# Patient Record
Sex: Female | Born: 1935 | Race: White | Hispanic: No | Smoking: Never smoker
Health system: Southern US, Community
[De-identification: ages and names within clinical notes are randomized; demographics above are authoritative.]

## PROBLEM LIST (undated history)

## (undated) DIAGNOSIS — I495 Sick sinus syndrome: Secondary | ICD-10-CM

## (undated) DIAGNOSIS — N289 Disorder of kidney and ureter, unspecified: Secondary | ICD-10-CM

## (undated) DIAGNOSIS — D649 Anemia, unspecified: Secondary | ICD-10-CM

## (undated) DIAGNOSIS — K5732 Diverticulitis of large intestine without perforation or abscess without bleeding: Secondary | ICD-10-CM

## (undated) DIAGNOSIS — I4430 Unspecified atrioventricular block: Secondary | ICD-10-CM

## (undated) DIAGNOSIS — F419 Anxiety disorder, unspecified: Secondary | ICD-10-CM

## (undated) DIAGNOSIS — I951 Orthostatic hypotension: Secondary | ICD-10-CM

## (undated) DIAGNOSIS — R059 Cough, unspecified: Secondary | ICD-10-CM

## (undated) DIAGNOSIS — R0989 Other specified symptoms and signs involving the circulatory and respiratory systems: Secondary | ICD-10-CM

## (undated) DIAGNOSIS — J329 Chronic sinusitis, unspecified: Secondary | ICD-10-CM

## (undated) DIAGNOSIS — E785 Hyperlipidemia, unspecified: Secondary | ICD-10-CM

## (undated) DIAGNOSIS — I2510 Atherosclerotic heart disease of native coronary artery without angina pectoris: Secondary | ICD-10-CM

## (undated) DIAGNOSIS — M545 Low back pain, unspecified: Secondary | ICD-10-CM

## (undated) DIAGNOSIS — R05 Cough: Secondary | ICD-10-CM

## (undated) DIAGNOSIS — I4891 Unspecified atrial fibrillation: Secondary | ICD-10-CM

## (undated) DIAGNOSIS — G4730 Sleep apnea, unspecified: Secondary | ICD-10-CM

## (undated) DIAGNOSIS — E7800 Pure hypercholesterolemia, unspecified: Secondary | ICD-10-CM

## (undated) DIAGNOSIS — F329 Major depressive disorder, single episode, unspecified: Secondary | ICD-10-CM

## (undated) DIAGNOSIS — F32A Depression, unspecified: Secondary | ICD-10-CM

## (undated) DIAGNOSIS — G20 Parkinson's disease: Secondary | ICD-10-CM

## (undated) DIAGNOSIS — I10 Essential (primary) hypertension: Secondary | ICD-10-CM

## (undated) DIAGNOSIS — E039 Hypothyroidism, unspecified: Secondary | ICD-10-CM

## (undated) DIAGNOSIS — G20A1 Parkinson's disease without dyskinesia, without mention of fluctuations: Secondary | ICD-10-CM

## (undated) DIAGNOSIS — R42 Dizziness and giddiness: Secondary | ICD-10-CM

## (undated) HISTORY — DX: Diverticulitis of large intestine without perforation or abscess without bleeding: K57.32

## (undated) HISTORY — DX: Chronic sinusitis, unspecified: J32.9

## (undated) HISTORY — DX: Dizziness and giddiness: R42

## (undated) HISTORY — DX: Other specified symptoms and signs involving the circulatory and respiratory systems: R09.89

## (undated) HISTORY — DX: Hypothyroidism, unspecified: E03.9

## (undated) HISTORY — DX: Hyperlipidemia, unspecified: E78.5

## (undated) HISTORY — DX: Anemia, unspecified: D64.9

## (undated) HISTORY — DX: Unspecified atrioventricular block: I44.30

## (undated) HISTORY — DX: Orthostatic hypotension: I95.1

## (undated) HISTORY — PX: CATARACT EXTRACTION: SUR2

## (undated) HISTORY — DX: Pure hypercholesterolemia, unspecified: E78.00

## (undated) HISTORY — DX: Depression, unspecified: F32.A

## (undated) HISTORY — DX: Sleep apnea, unspecified: G47.30

## (undated) HISTORY — PX: OTHER SURGICAL HISTORY: SHX169

## (undated) HISTORY — PX: PACEMAKER INSERTION: SHX728

## (undated) HISTORY — DX: Parkinson's disease without dyskinesia, without mention of fluctuations: G20.A1

## (undated) HISTORY — DX: Cough: R05

## (undated) HISTORY — DX: Essential (primary) hypertension: I10

## (undated) HISTORY — DX: Low back pain, unspecified: M54.50

## (undated) HISTORY — DX: Unspecified atrial fibrillation: I48.91

## (undated) HISTORY — DX: Atherosclerotic heart disease of native coronary artery without angina pectoris: I25.10

## (undated) HISTORY — DX: Disorder of kidney and ureter, unspecified: N28.9

## (undated) HISTORY — DX: Low back pain: M54.5

## (undated) HISTORY — DX: Parkinson's disease: G20

## (undated) HISTORY — DX: Sick sinus syndrome: I49.5

## (undated) HISTORY — DX: Cough, unspecified: R05.9

## (undated) HISTORY — PX: CORONARY ARTERY BYPASS GRAFT: SHX141

## (undated) HISTORY — DX: Major depressive disorder, single episode, unspecified: F32.9

## (undated) HISTORY — PX: POLYPECTOMY: SHX149

## (undated) HISTORY — DX: Anxiety disorder, unspecified: F41.9

---

## 1998-01-02 ENCOUNTER — Other Ambulatory Visit: Admission: RE | Admit: 1998-01-02 | Discharge: 1998-01-02 | Payer: Self-pay | Admitting: *Deleted

## 1998-02-21 ENCOUNTER — Ambulatory Visit (HOSPITAL_COMMUNITY): Admission: RE | Admit: 1998-02-21 | Discharge: 1998-02-21 | Payer: Self-pay | Admitting: *Deleted

## 1998-03-21 ENCOUNTER — Other Ambulatory Visit: Admission: RE | Admit: 1998-03-21 | Discharge: 1998-03-21 | Payer: Self-pay | Admitting: Neurology

## 1998-03-23 ENCOUNTER — Inpatient Hospital Stay (HOSPITAL_COMMUNITY): Admission: RE | Admit: 1998-03-23 | Discharge: 1998-03-29 | Payer: Self-pay | Admitting: Cardiology

## 1998-04-20 ENCOUNTER — Other Ambulatory Visit: Admission: RE | Admit: 1998-04-20 | Discharge: 1998-04-20 | Payer: Self-pay | Admitting: Internal Medicine

## 1998-05-01 ENCOUNTER — Ambulatory Visit (HOSPITAL_COMMUNITY): Admission: AD | Admit: 1998-05-01 | Discharge: 1998-05-02 | Payer: Self-pay | Admitting: Internal Medicine

## 1998-06-13 ENCOUNTER — Encounter (HOSPITAL_COMMUNITY): Admission: RE | Admit: 1998-06-13 | Discharge: 1998-09-11 | Payer: Self-pay | Admitting: Cardiology

## 1998-10-04 ENCOUNTER — Other Ambulatory Visit: Admission: RE | Admit: 1998-10-04 | Discharge: 1998-10-04 | Payer: Self-pay | Admitting: *Deleted

## 1999-09-26 ENCOUNTER — Other Ambulatory Visit: Admission: RE | Admit: 1999-09-26 | Discharge: 1999-09-26 | Payer: Self-pay | Admitting: *Deleted

## 2000-10-06 ENCOUNTER — Other Ambulatory Visit: Admission: RE | Admit: 2000-10-06 | Discharge: 2000-10-06 | Payer: Self-pay | Admitting: *Deleted

## 2000-11-14 ENCOUNTER — Encounter: Payer: Self-pay | Admitting: Gastroenterology

## 2000-11-14 ENCOUNTER — Encounter (INDEPENDENT_AMBULATORY_CARE_PROVIDER_SITE_OTHER): Payer: Self-pay

## 2000-11-14 ENCOUNTER — Other Ambulatory Visit: Admission: RE | Admit: 2000-11-14 | Discharge: 2000-11-14 | Payer: Self-pay | Admitting: Gastroenterology

## 2001-10-19 ENCOUNTER — Other Ambulatory Visit: Admission: RE | Admit: 2001-10-19 | Discharge: 2001-10-19 | Payer: Self-pay | Admitting: *Deleted

## 2004-09-28 ENCOUNTER — Ambulatory Visit: Payer: Self-pay | Admitting: Family Medicine

## 2004-10-01 ENCOUNTER — Ambulatory Visit: Payer: Self-pay | Admitting: Family Medicine

## 2004-10-01 ENCOUNTER — Ambulatory Visit: Payer: Self-pay | Admitting: Cardiology

## 2004-10-09 ENCOUNTER — Ambulatory Visit: Payer: Self-pay | Admitting: *Deleted

## 2004-10-09 ENCOUNTER — Encounter: Admission: RE | Admit: 2004-10-09 | Discharge: 2004-10-09 | Payer: Self-pay | Admitting: Family Medicine

## 2004-10-19 ENCOUNTER — Ambulatory Visit: Payer: Self-pay

## 2004-10-24 ENCOUNTER — Ambulatory Visit: Payer: Self-pay | Admitting: Cardiology

## 2004-11-06 ENCOUNTER — Ambulatory Visit: Payer: Self-pay

## 2004-11-12 ENCOUNTER — Ambulatory Visit: Payer: Self-pay | Admitting: Cardiovascular Disease

## 2004-11-29 ENCOUNTER — Ambulatory Visit: Payer: Self-pay

## 2004-12-10 ENCOUNTER — Ambulatory Visit: Payer: Self-pay | Admitting: *Deleted

## 2004-12-21 ENCOUNTER — Ambulatory Visit: Payer: Self-pay

## 2004-12-25 ENCOUNTER — Ambulatory Visit: Payer: Self-pay | Admitting: Internal Medicine

## 2004-12-28 ENCOUNTER — Ambulatory Visit: Payer: Self-pay

## 2005-01-01 ENCOUNTER — Ambulatory Visit (HOSPITAL_COMMUNITY): Admission: RE | Admit: 2005-01-01 | Discharge: 2005-01-01 | Payer: Self-pay | Admitting: Internal Medicine

## 2005-01-01 ENCOUNTER — Ambulatory Visit: Payer: Self-pay | Admitting: Internal Medicine

## 2005-01-07 ENCOUNTER — Ambulatory Visit: Payer: Self-pay | Admitting: Cardiology

## 2005-01-14 ENCOUNTER — Ambulatory Visit: Payer: Self-pay

## 2005-01-14 ENCOUNTER — Ambulatory Visit: Payer: Self-pay | Admitting: *Deleted

## 2005-01-21 ENCOUNTER — Ambulatory Visit: Payer: Self-pay | Admitting: Cardiology

## 2005-01-24 ENCOUNTER — Ambulatory Visit: Payer: Self-pay | Admitting: Internal Medicine

## 2005-01-25 ENCOUNTER — Ambulatory Visit: Payer: Self-pay | Admitting: Cardiology

## 2005-02-04 ENCOUNTER — Ambulatory Visit: Payer: Self-pay | Admitting: Cardiology

## 2005-02-11 ENCOUNTER — Ambulatory Visit: Payer: Self-pay

## 2005-03-04 ENCOUNTER — Ambulatory Visit: Payer: Self-pay | Admitting: Cardiology

## 2005-03-13 ENCOUNTER — Ambulatory Visit: Payer: Self-pay | Admitting: Cardiology

## 2005-03-14 ENCOUNTER — Ambulatory Visit: Payer: Self-pay | Admitting: Cardiology

## 2005-04-01 ENCOUNTER — Ambulatory Visit: Payer: Self-pay | Admitting: *Deleted

## 2005-04-26 ENCOUNTER — Ambulatory Visit: Payer: Self-pay | Admitting: Cardiology

## 2005-04-29 ENCOUNTER — Ambulatory Visit: Payer: Self-pay | Admitting: Cardiology

## 2005-05-14 ENCOUNTER — Ambulatory Visit: Payer: Self-pay

## 2005-05-29 ENCOUNTER — Ambulatory Visit: Payer: Self-pay | Admitting: Internal Medicine

## 2005-05-31 ENCOUNTER — Ambulatory Visit: Payer: Self-pay | Admitting: Internal Medicine

## 2005-06-03 ENCOUNTER — Ambulatory Visit: Payer: Self-pay | Admitting: Cardiology

## 2005-06-24 ENCOUNTER — Ambulatory Visit: Payer: Self-pay | Admitting: Cardiology

## 2005-07-09 ENCOUNTER — Ambulatory Visit: Payer: Self-pay | Admitting: Cardiology

## 2005-07-11 ENCOUNTER — Ambulatory Visit: Payer: Self-pay

## 2005-07-24 ENCOUNTER — Ambulatory Visit: Payer: Self-pay | Admitting: *Deleted

## 2005-08-14 ENCOUNTER — Ambulatory Visit: Payer: Self-pay | Admitting: Internal Medicine

## 2005-08-20 ENCOUNTER — Ambulatory Visit: Payer: Self-pay | Admitting: *Deleted

## 2005-09-03 ENCOUNTER — Ambulatory Visit: Payer: Self-pay | Admitting: Cardiovascular Disease

## 2005-09-06 ENCOUNTER — Ambulatory Visit: Payer: Self-pay | Admitting: Cardiology

## 2005-09-25 ENCOUNTER — Ambulatory Visit: Payer: Self-pay | Admitting: *Deleted

## 2005-10-15 ENCOUNTER — Ambulatory Visit: Payer: Self-pay | Admitting: Cardiology

## 2005-10-23 ENCOUNTER — Ambulatory Visit: Payer: Self-pay | Admitting: Cardiology

## 2005-11-05 ENCOUNTER — Ambulatory Visit: Payer: Self-pay

## 2005-11-08 ENCOUNTER — Ambulatory Visit: Payer: Self-pay | Admitting: Cardiology

## 2005-11-20 ENCOUNTER — Ambulatory Visit: Payer: Self-pay | Admitting: *Deleted

## 2005-12-18 ENCOUNTER — Ambulatory Visit: Payer: Self-pay | Admitting: Cardiology

## 2005-12-31 ENCOUNTER — Ambulatory Visit: Payer: Self-pay | Admitting: Internal Medicine

## 2006-01-06 ENCOUNTER — Ambulatory Visit: Payer: Self-pay | Admitting: Family Medicine

## 2006-01-07 ENCOUNTER — Ambulatory Visit: Payer: Self-pay | Admitting: Internal Medicine

## 2006-01-08 ENCOUNTER — Ambulatory Visit: Payer: Self-pay | Admitting: Family Medicine

## 2006-01-29 ENCOUNTER — Ambulatory Visit: Payer: Self-pay | Admitting: Internal Medicine

## 2006-02-18 ENCOUNTER — Ambulatory Visit: Payer: Self-pay | Admitting: Cardiology

## 2006-02-26 ENCOUNTER — Ambulatory Visit: Payer: Self-pay | Admitting: Cardiology

## 2006-03-03 ENCOUNTER — Ambulatory Visit: Payer: Self-pay | Admitting: Cardiology

## 2006-03-26 ENCOUNTER — Ambulatory Visit: Payer: Self-pay | Admitting: Cardiology

## 2006-04-07 ENCOUNTER — Ambulatory Visit: Payer: Self-pay | Admitting: Internal Medicine

## 2006-04-29 ENCOUNTER — Ambulatory Visit: Payer: Self-pay | Admitting: Cardiology

## 2006-05-30 ENCOUNTER — Ambulatory Visit: Payer: Self-pay | Admitting: Internal Medicine

## 2006-06-25 ENCOUNTER — Ambulatory Visit: Payer: Self-pay | Admitting: *Deleted

## 2006-07-07 ENCOUNTER — Ambulatory Visit: Payer: Self-pay | Admitting: Internal Medicine

## 2006-07-16 ENCOUNTER — Ambulatory Visit: Payer: Self-pay | Admitting: Cardiology

## 2006-08-05 ENCOUNTER — Ambulatory Visit: Payer: Self-pay | Admitting: Cardiovascular Disease

## 2006-08-05 ENCOUNTER — Ambulatory Visit: Payer: Self-pay | Admitting: Internal Medicine

## 2006-08-08 ENCOUNTER — Encounter: Admission: RE | Admit: 2006-08-08 | Discharge: 2006-08-08 | Payer: Self-pay | Admitting: Internal Medicine

## 2006-08-18 ENCOUNTER — Ambulatory Visit: Payer: Self-pay | Admitting: Internal Medicine

## 2006-08-21 ENCOUNTER — Ambulatory Visit: Payer: Self-pay | Admitting: Cardiology

## 2006-08-28 ENCOUNTER — Ambulatory Visit: Payer: Self-pay | Admitting: Cardiology

## 2006-09-10 ENCOUNTER — Ambulatory Visit: Payer: Self-pay | Admitting: Internal Medicine

## 2006-09-24 ENCOUNTER — Ambulatory Visit: Payer: Self-pay | Admitting: Cardiology

## 2006-10-07 ENCOUNTER — Ambulatory Visit: Payer: Self-pay | Admitting: Internal Medicine

## 2006-10-08 ENCOUNTER — Ambulatory Visit: Payer: Self-pay | Admitting: Cardiology

## 2006-10-10 ENCOUNTER — Ambulatory Visit: Payer: Self-pay | Admitting: Internal Medicine

## 2006-10-15 ENCOUNTER — Ambulatory Visit: Payer: Self-pay | Admitting: Cardiology

## 2006-10-22 ENCOUNTER — Ambulatory Visit: Payer: Self-pay | Admitting: Cardiovascular Disease

## 2006-10-30 ENCOUNTER — Ambulatory Visit: Payer: Self-pay

## 2006-10-30 ENCOUNTER — Ambulatory Visit: Payer: Self-pay | Admitting: Cardiovascular Disease

## 2006-11-19 ENCOUNTER — Ambulatory Visit: Payer: Self-pay | Admitting: Cardiology

## 2006-12-03 ENCOUNTER — Ambulatory Visit: Payer: Self-pay | Admitting: Cardiology

## 2006-12-03 ENCOUNTER — Ambulatory Visit: Payer: Self-pay | Admitting: Internal Medicine

## 2006-12-31 ENCOUNTER — Ambulatory Visit: Payer: Self-pay | Admitting: Cardiology

## 2007-01-01 ENCOUNTER — Ambulatory Visit: Payer: Self-pay | Admitting: Internal Medicine

## 2007-01-26 ENCOUNTER — Ambulatory Visit: Payer: Self-pay | Admitting: Internal Medicine

## 2007-01-28 ENCOUNTER — Ambulatory Visit: Payer: Self-pay | Admitting: *Deleted

## 2007-02-19 ENCOUNTER — Ambulatory Visit: Payer: Self-pay | Admitting: Internal Medicine

## 2007-02-19 LAB — CONVERTED CEMR LAB
BUN: 28 mg/dL — ABNORMAL HIGH (ref 6–23)
TSH: 2.37 microintl units/mL (ref 0.35–5.50)

## 2007-02-25 ENCOUNTER — Ambulatory Visit: Payer: Self-pay | Admitting: Cardiovascular Disease

## 2007-02-26 ENCOUNTER — Ambulatory Visit: Payer: Self-pay | Admitting: Cardiology

## 2007-02-26 LAB — CONVERTED CEMR LAB
ALT: 9 units/L (ref 0–40)
AST: 16 units/L (ref 0–37)
Albumin: 3.4 g/dL — ABNORMAL LOW (ref 3.5–5.2)
Alkaline Phosphatase: 56 units/L (ref 39–117)
Bilirubin, Direct: 0.1 mg/dL (ref 0.0–0.3)
Cholesterol: 247 mg/dL (ref 0–200)
Direct LDL: 162.4 mg/dL
HDL: 51.7 mg/dL (ref >39.0)
Total Bilirubin: 0.7 mg/dL (ref 0.3–1.2)
Total CHOL/HDL Ratio: 4.8
Total Protein: 6.2 g/dL (ref 6.0–8.3)
Triglycerides: 174 mg/dL — ABNORMAL HIGH (ref 0–149)
VLDL: 35 mg/dL (ref 0–40)

## 2007-03-02 DIAGNOSIS — I4891 Unspecified atrial fibrillation: Secondary | ICD-10-CM | POA: Insufficient documentation

## 2007-03-02 DIAGNOSIS — D649 Anemia, unspecified: Secondary | ICD-10-CM | POA: Insufficient documentation

## 2007-03-02 DIAGNOSIS — K5730 Diverticulosis of large intestine without perforation or abscess without bleeding: Secondary | ICD-10-CM | POA: Insufficient documentation

## 2007-03-02 DIAGNOSIS — R0989 Other specified symptoms and signs involving the circulatory and respiratory systems: Secondary | ICD-10-CM

## 2007-03-02 DIAGNOSIS — G4730 Sleep apnea, unspecified: Secondary | ICD-10-CM | POA: Insufficient documentation

## 2007-03-05 ENCOUNTER — Ambulatory Visit: Payer: Self-pay | Admitting: Cardiology

## 2007-03-16 ENCOUNTER — Ambulatory Visit: Payer: Self-pay | Admitting: Internal Medicine

## 2007-03-19 ENCOUNTER — Ambulatory Visit: Payer: Self-pay

## 2007-03-24 ENCOUNTER — Ambulatory Visit: Payer: Self-pay | Admitting: Internal Medicine

## 2007-03-25 ENCOUNTER — Ambulatory Visit: Payer: Self-pay | Admitting: Internal Medicine

## 2007-04-22 ENCOUNTER — Ambulatory Visit: Payer: Self-pay | Admitting: Cardiology

## 2007-04-22 ENCOUNTER — Encounter: Payer: Self-pay | Admitting: Internal Medicine

## 2007-04-22 ENCOUNTER — Ambulatory Visit: Payer: Self-pay | Admitting: Internal Medicine

## 2007-04-27 ENCOUNTER — Ambulatory Visit: Payer: Self-pay | Admitting: Cardiovascular Disease

## 2007-04-27 ENCOUNTER — Ambulatory Visit: Payer: Self-pay | Admitting: Internal Medicine

## 2007-04-27 ENCOUNTER — Encounter: Admission: RE | Admit: 2007-04-27 | Discharge: 2007-04-27 | Payer: Self-pay | Admitting: Internal Medicine

## 2007-05-11 ENCOUNTER — Ambulatory Visit: Payer: Self-pay | Admitting: Cardiology

## 2007-05-11 LAB — CONVERTED CEMR LAB
BUN: 49 mg/dL — ABNORMAL HIGH (ref 6–23)
Basophils Absolute: 0 10*3/uL (ref 0.0–0.1)
Basophils Relative: 0 % (ref 0.0–1.0)
CO2: 32 meq/L (ref 19–32)
Calcium: 9.5 mg/dL (ref 8.4–10.5)
Chloride: 102 meq/L (ref 96–112)
Creatinine, Ser: 1.6 mg/dL — ABNORMAL HIGH (ref 0.4–1.2)
Eosinophils Absolute: 0 10*3/uL (ref 0.0–0.6)
Eosinophils Relative: 0.2 % (ref 0.0–5.0)
GFR calc Af Amer: 40 mL/min
GFR calc non Af Amer: 33 mL/min
Glucose, Bld: 139 mg/dL — ABNORMAL HIGH (ref 70–99)
HCT: 40.7 % (ref 36.0–46.0)
Hemoglobin: 14.1 g/dL (ref 12.0–15.0)
Lymphocytes Relative: 13.9 % (ref 12.0–46.0)
MCHC: 34.5 g/dL (ref 30.0–36.0)
MCV: 91.2 fL (ref 78.0–100.0)
Monocytes Absolute: 0.3 10*3/uL (ref 0.2–0.7)
Monocytes Relative: 2.6 % — ABNORMAL LOW (ref 3.0–11.0)
Neutro Abs: 8.6 10*3/uL — ABNORMAL HIGH (ref 1.4–7.7)
Neutrophils Relative %: 83.3 % — ABNORMAL HIGH (ref 43.0–77.0)
Platelets: 286 10*3/uL (ref 150–400)
Potassium: 4.7 meq/L (ref 3.5–5.1)
RBC: 4.47 M/uL (ref 3.87–5.11)
RDW: 14.2 % (ref 11.5–14.6)
Sodium: 139 meq/L (ref 135–145)
WBC: 10.3 10*3/uL (ref 4.5–10.5)

## 2007-05-18 ENCOUNTER — Ambulatory Visit: Payer: Self-pay | Admitting: Cardiovascular Disease

## 2007-05-18 ENCOUNTER — Ambulatory Visit: Payer: Self-pay | Admitting: Cardiology

## 2007-05-18 LAB — CONVERTED CEMR LAB
BUN: 26 mg/dL — ABNORMAL HIGH (ref 6–23)
CO2: 30 meq/L (ref 19–32)
Calcium: 8.8 mg/dL (ref 8.4–10.5)
Chloride: 112 meq/L (ref 96–112)
Creatinine, Ser: 1.4 mg/dL — ABNORMAL HIGH (ref 0.4–1.2)
GFR calc Af Amer: 47 mL/min
GFR calc non Af Amer: 39 mL/min
Glucose, Bld: 90 mg/dL (ref 70–99)
Potassium: 4.3 meq/L (ref 3.5–5.1)
Sodium: 143 meq/L (ref 135–145)

## 2007-06-11 ENCOUNTER — Ambulatory Visit: Payer: Self-pay | Admitting: Cardiology

## 2007-07-13 ENCOUNTER — Ambulatory Visit: Payer: Self-pay | Admitting: Cardiology

## 2007-07-14 ENCOUNTER — Ambulatory Visit: Payer: Self-pay | Admitting: Internal Medicine

## 2007-08-06 ENCOUNTER — Ambulatory Visit: Payer: Self-pay | Admitting: Cardiology

## 2007-08-13 ENCOUNTER — Ambulatory Visit: Payer: Self-pay | Admitting: Cardiology

## 2007-08-13 ENCOUNTER — Ambulatory Visit: Payer: Self-pay

## 2007-08-13 LAB — CONVERTED CEMR LAB
Albumin: 3.6 g/dL (ref 3.5–5.2)
Alkaline Phosphatase: 50 units/L (ref 39–117)
BUN: 38 mg/dL — ABNORMAL HIGH (ref 6–23)
Bilirubin, Direct: 0.1 mg/dL (ref 0.0–0.3)
CO2: 30 meq/L (ref 19–32)
Calcium: 9.8 mg/dL (ref 8.4–10.5)
Chloride: 105 meq/L (ref 96–112)
GFR calc Af Amer: 35 mL/min
GFR calc non Af Amer: 29 mL/min
Glucose, Bld: 103 mg/dL — ABNORMAL HIGH (ref 70–99)
HDL: 51.4 mg/dL (ref >39.0)
LDL Cholesterol: 71 mg/dL (ref 0–99)
Potassium: 4.1 meq/L (ref 3.5–5.1)
Sodium: 142 meq/L (ref 135–145)
Total CHOL/HDL Ratio: 2.8
Triglycerides: 106 mg/dL (ref 0–149)
VLDL: 21 mg/dL (ref 0–40)

## 2007-08-24 ENCOUNTER — Ambulatory Visit: Payer: Self-pay | Admitting: Cardiology

## 2007-08-24 LAB — CONVERTED CEMR LAB
BUN: 27 mg/dL — ABNORMAL HIGH (ref 6–23)
CO2: 34 meq/L — ABNORMAL HIGH (ref 19–32)
Calcium: 9.6 mg/dL (ref 8.4–10.5)
Chloride: 106 meq/L (ref 96–112)
Creatinine, Ser: 1.6 mg/dL — ABNORMAL HIGH (ref 0.4–1.2)
GFR calc Af Amer: 40 mL/min
GFR calc non Af Amer: 33 mL/min
Potassium: 4 meq/L (ref 3.5–5.1)
Sodium: 144 meq/L (ref 135–145)

## 2007-08-26 ENCOUNTER — Encounter: Payer: Self-pay | Admitting: Internal Medicine

## 2007-08-28 ENCOUNTER — Ambulatory Visit: Payer: Self-pay | Admitting: Internal Medicine

## 2007-08-28 DIAGNOSIS — N259 Disorder resulting from impaired renal tubular function, unspecified: Secondary | ICD-10-CM | POA: Insufficient documentation

## 2007-09-02 ENCOUNTER — Telehealth (INDEPENDENT_AMBULATORY_CARE_PROVIDER_SITE_OTHER): Payer: Self-pay | Admitting: *Deleted

## 2007-09-03 ENCOUNTER — Ambulatory Visit: Payer: Self-pay

## 2007-09-08 ENCOUNTER — Ambulatory Visit: Payer: Self-pay | Admitting: Internal Medicine

## 2007-09-14 ENCOUNTER — Ambulatory Visit: Payer: Self-pay | Admitting: Internal Medicine

## 2007-09-16 ENCOUNTER — Ambulatory Visit: Payer: Self-pay | Admitting: Cardiology

## 2007-09-23 ENCOUNTER — Ambulatory Visit: Payer: Self-pay | Admitting: Cardiology

## 2007-09-23 ENCOUNTER — Telehealth: Payer: Self-pay | Admitting: Internal Medicine

## 2007-09-30 ENCOUNTER — Telehealth (INDEPENDENT_AMBULATORY_CARE_PROVIDER_SITE_OTHER): Payer: Self-pay | Admitting: *Deleted

## 2007-10-13 ENCOUNTER — Ambulatory Visit: Payer: Self-pay | Admitting: Internal Medicine

## 2007-10-19 ENCOUNTER — Encounter: Payer: Self-pay | Admitting: Internal Medicine

## 2007-10-27 ENCOUNTER — Ambulatory Visit: Payer: Self-pay | Admitting: Internal Medicine

## 2007-11-03 ENCOUNTER — Ambulatory Visit: Payer: Self-pay

## 2007-11-03 ENCOUNTER — Ambulatory Visit: Payer: Self-pay | Admitting: Cardiology

## 2007-11-03 ENCOUNTER — Encounter: Payer: Self-pay | Admitting: Internal Medicine

## 2007-11-03 LAB — CONVERTED CEMR LAB
Calcium: 9.7 mg/dL (ref 8.4–10.5)
Chloride: 99 meq/L (ref 96–112)
Creatinine, Ser: 1.4 mg/dL — ABNORMAL HIGH (ref 0.4–1.2)
GFR calc Af Amer: 47 mL/min
GFR calc non Af Amer: 39 mL/min
Glucose, Bld: 84 mg/dL (ref 70–99)
Potassium: 4.1 meq/L (ref 3.5–5.1)
Sodium: 137 meq/L (ref 135–145)

## 2007-12-02 ENCOUNTER — Ambulatory Visit: Payer: Self-pay | Admitting: Cardiovascular Disease

## 2007-12-21 ENCOUNTER — Telehealth (INDEPENDENT_AMBULATORY_CARE_PROVIDER_SITE_OTHER): Payer: Self-pay | Admitting: *Deleted

## 2007-12-30 ENCOUNTER — Ambulatory Visit: Payer: Self-pay | Admitting: Internal Medicine

## 2008-01-05 ENCOUNTER — Ambulatory Visit: Payer: Self-pay | Admitting: Internal Medicine

## 2008-01-27 ENCOUNTER — Ambulatory Visit: Payer: Self-pay | Admitting: Cardiology

## 2008-02-04 ENCOUNTER — Ambulatory Visit: Payer: Self-pay | Admitting: Cardiology

## 2008-02-04 ENCOUNTER — Encounter: Payer: Self-pay | Admitting: Internal Medicine

## 2008-02-04 LAB — CONVERTED CEMR LAB
BUN: 35 mg/dL — ABNORMAL HIGH (ref 6–23)
CO2: 30 meq/L (ref 19–32)
Calcium: 9.3 mg/dL (ref 8.4–10.5)
Chloride: 105 meq/L (ref 96–112)
Creatinine, Ser: 1.7 mg/dL — ABNORMAL HIGH (ref 0.4–1.2)
GFR calc Af Amer: 37 mL/min
GFR calc non Af Amer: 31 mL/min
Glucose, Bld: 116 mg/dL — ABNORMAL HIGH (ref 70–99)
Potassium: 4.1 meq/L (ref 3.5–5.1)
Sodium: 141 meq/L (ref 135–145)

## 2008-02-24 ENCOUNTER — Ambulatory Visit: Payer: Self-pay | Admitting: Cardiology

## 2008-02-24 ENCOUNTER — Ambulatory Visit: Payer: Self-pay | Admitting: Internal Medicine

## 2008-02-24 LAB — CONVERTED CEMR LAB
CO2: 29 meq/L (ref 19–32)
Calcium: 9.5 mg/dL (ref 8.4–10.5)
Chloride: 108 meq/L (ref 96–112)
Creatinine, Ser: 1.4 mg/dL — ABNORMAL HIGH (ref 0.4–1.2)
GFR calc Af Amer: 47 mL/min
GFR calc non Af Amer: 39 mL/min
Glucose, Bld: 78 mg/dL (ref 70–99)
Potassium: 4.4 meq/L (ref 3.5–5.1)
Sodium: 142 meq/L (ref 135–145)

## 2008-03-22 ENCOUNTER — Encounter: Payer: Self-pay | Admitting: Internal Medicine

## 2008-03-30 ENCOUNTER — Ambulatory Visit: Payer: Self-pay | Admitting: Cardiology

## 2008-03-30 ENCOUNTER — Ambulatory Visit: Payer: Self-pay | Admitting: Internal Medicine

## 2008-03-30 LAB — CONVERTED CEMR LAB
BUN: 30 mg/dL — ABNORMAL HIGH (ref 6–23)
CO2: 30 meq/L (ref 19–32)
Calcium: 9.6 mg/dL (ref 8.4–10.5)
Chloride: 109 meq/L (ref 96–112)
Creatinine, Ser: 1.5 mg/dL — ABNORMAL HIGH (ref 0.4–1.2)
GFR calc Af Amer: 43 mL/min
GFR calc non Af Amer: 36 mL/min
Glucose, Bld: 79 mg/dL (ref 70–99)
Sodium: 142 meq/L (ref 135–145)

## 2008-04-05 ENCOUNTER — Ambulatory Visit: Payer: Self-pay | Admitting: Internal Medicine

## 2008-04-26 ENCOUNTER — Ambulatory Visit: Payer: Self-pay | Admitting: Internal Medicine

## 2008-05-03 ENCOUNTER — Ambulatory Visit: Payer: Self-pay | Admitting: Cardiovascular Disease

## 2008-05-03 ENCOUNTER — Ambulatory Visit: Payer: Self-pay

## 2008-05-16 ENCOUNTER — Telehealth (INDEPENDENT_AMBULATORY_CARE_PROVIDER_SITE_OTHER): Payer: Self-pay | Admitting: *Deleted

## 2008-06-01 ENCOUNTER — Ambulatory Visit: Payer: Self-pay | Admitting: Internal Medicine

## 2008-06-20 ENCOUNTER — Telehealth (INDEPENDENT_AMBULATORY_CARE_PROVIDER_SITE_OTHER): Payer: Self-pay | Admitting: *Deleted

## 2008-06-22 ENCOUNTER — Ambulatory Visit: Payer: Self-pay | Admitting: Cardiology

## 2008-07-05 ENCOUNTER — Ambulatory Visit: Payer: Self-pay | Admitting: Internal Medicine

## 2008-07-06 ENCOUNTER — Ambulatory Visit: Payer: Self-pay | Admitting: Cardiology

## 2008-07-20 ENCOUNTER — Ambulatory Visit: Payer: Self-pay | Admitting: Cardiology

## 2008-07-22 ENCOUNTER — Ambulatory Visit: Payer: Self-pay | Admitting: Family Medicine

## 2008-07-22 ENCOUNTER — Telehealth (INDEPENDENT_AMBULATORY_CARE_PROVIDER_SITE_OTHER): Payer: Self-pay | Admitting: *Deleted

## 2008-07-22 DIAGNOSIS — M545 Low back pain: Secondary | ICD-10-CM

## 2008-07-25 ENCOUNTER — Telehealth (INDEPENDENT_AMBULATORY_CARE_PROVIDER_SITE_OTHER): Payer: Self-pay | Admitting: *Deleted

## 2008-07-28 ENCOUNTER — Ambulatory Visit: Payer: Self-pay | Admitting: Internal Medicine

## 2008-07-29 ENCOUNTER — Telehealth (INDEPENDENT_AMBULATORY_CARE_PROVIDER_SITE_OTHER): Payer: Self-pay | Admitting: *Deleted

## 2008-08-04 ENCOUNTER — Encounter (INDEPENDENT_AMBULATORY_CARE_PROVIDER_SITE_OTHER): Payer: Self-pay | Admitting: *Deleted

## 2008-08-10 ENCOUNTER — Ambulatory Visit: Payer: Self-pay | Admitting: Cardiovascular Disease

## 2008-08-29 ENCOUNTER — Ambulatory Visit: Payer: Self-pay | Admitting: Internal Medicine

## 2008-08-29 DIAGNOSIS — E039 Hypothyroidism, unspecified: Secondary | ICD-10-CM | POA: Insufficient documentation

## 2008-08-29 DIAGNOSIS — F329 Major depressive disorder, single episode, unspecified: Secondary | ICD-10-CM | POA: Insufficient documentation

## 2008-08-29 DIAGNOSIS — F411 Generalized anxiety disorder: Secondary | ICD-10-CM | POA: Insufficient documentation

## 2008-08-29 DIAGNOSIS — E785 Hyperlipidemia, unspecified: Secondary | ICD-10-CM

## 2008-08-29 DIAGNOSIS — I10 Essential (primary) hypertension: Secondary | ICD-10-CM | POA: Insufficient documentation

## 2008-08-29 LAB — CONVERTED CEMR LAB: TSH: 1.69 microintl units/mL (ref 0.35–5.50)

## 2008-08-30 ENCOUNTER — Encounter (INDEPENDENT_AMBULATORY_CARE_PROVIDER_SITE_OTHER): Payer: Self-pay | Admitting: *Deleted

## 2008-08-31 ENCOUNTER — Ambulatory Visit: Payer: Self-pay | Admitting: Internal Medicine

## 2008-09-05 ENCOUNTER — Encounter: Payer: Self-pay | Admitting: Family Medicine

## 2008-09-08 ENCOUNTER — Ambulatory Visit: Payer: Self-pay | Admitting: Internal Medicine

## 2008-09-09 ENCOUNTER — Ambulatory Visit (HOSPITAL_COMMUNITY): Admission: RE | Admit: 2008-09-09 | Discharge: 2008-09-09 | Payer: Self-pay | Admitting: Neurosurgery

## 2008-09-14 ENCOUNTER — Encounter: Payer: Self-pay | Admitting: Family Medicine

## 2008-09-16 ENCOUNTER — Ambulatory Visit: Payer: Self-pay | Admitting: Cardiology

## 2008-09-23 ENCOUNTER — Ambulatory Visit: Payer: Self-pay | Admitting: Internal Medicine

## 2008-09-27 ENCOUNTER — Ambulatory Visit (HOSPITAL_COMMUNITY): Admission: RE | Admit: 2008-09-27 | Discharge: 2008-09-27 | Payer: Self-pay | Admitting: Neurosurgery

## 2008-09-27 ENCOUNTER — Ambulatory Visit: Payer: Self-pay | Admitting: Cardiology

## 2008-09-28 ENCOUNTER — Ambulatory Visit: Payer: Self-pay

## 2008-09-28 ENCOUNTER — Encounter: Payer: Self-pay | Admitting: Internal Medicine

## 2008-10-04 ENCOUNTER — Ambulatory Visit: Payer: Self-pay | Admitting: Internal Medicine

## 2008-10-07 ENCOUNTER — Ambulatory Visit: Payer: Self-pay | Admitting: Internal Medicine

## 2008-11-02 ENCOUNTER — Ambulatory Visit: Payer: Self-pay | Admitting: Internal Medicine

## 2008-11-03 ENCOUNTER — Ambulatory Visit: Payer: Self-pay | Admitting: Cardiology

## 2008-11-03 ENCOUNTER — Ambulatory Visit: Payer: Self-pay

## 2008-11-10 ENCOUNTER — Inpatient Hospital Stay (HOSPITAL_COMMUNITY): Admission: RE | Admit: 2008-11-10 | Discharge: 2008-11-11 | Payer: Self-pay | Admitting: Neurosurgery

## 2008-11-23 ENCOUNTER — Ambulatory Visit: Payer: Self-pay | Admitting: Internal Medicine

## 2008-12-01 ENCOUNTER — Ambulatory Visit: Payer: Self-pay | Admitting: Cardiovascular Disease

## 2008-12-06 ENCOUNTER — Ambulatory Visit: Payer: Self-pay | Admitting: Cardiology

## 2008-12-06 ENCOUNTER — Ambulatory Visit: Payer: Self-pay | Admitting: Internal Medicine

## 2008-12-07 ENCOUNTER — Encounter: Payer: Self-pay | Admitting: Family Medicine

## 2009-01-04 ENCOUNTER — Ambulatory Visit: Payer: Self-pay | Admitting: Cardiology

## 2009-01-18 ENCOUNTER — Encounter: Payer: Self-pay | Admitting: Family Medicine

## 2009-02-01 ENCOUNTER — Ambulatory Visit: Payer: Self-pay | Admitting: Cardiovascular Disease

## 2009-03-01 ENCOUNTER — Ambulatory Visit: Payer: Self-pay | Admitting: Cardiology

## 2009-03-10 ENCOUNTER — Encounter (INDEPENDENT_AMBULATORY_CARE_PROVIDER_SITE_OTHER): Payer: Self-pay | Admitting: *Deleted

## 2009-03-29 ENCOUNTER — Ambulatory Visit: Payer: Self-pay | Admitting: Cardiovascular Disease

## 2009-04-03 DIAGNOSIS — Z9189 Other specified personal risk factors, not elsewhere classified: Secondary | ICD-10-CM | POA: Insufficient documentation

## 2009-04-03 DIAGNOSIS — Z9849 Cataract extraction status, unspecified eye: Secondary | ICD-10-CM | POA: Insufficient documentation

## 2009-04-03 DIAGNOSIS — G20 Parkinson's disease: Secondary | ICD-10-CM

## 2009-04-03 DIAGNOSIS — Z951 Presence of aortocoronary bypass graft: Secondary | ICD-10-CM | POA: Insufficient documentation

## 2009-04-03 DIAGNOSIS — I2510 Atherosclerotic heart disease of native coronary artery without angina pectoris: Secondary | ICD-10-CM | POA: Insufficient documentation

## 2009-04-04 ENCOUNTER — Ambulatory Visit: Payer: Self-pay | Admitting: Internal Medicine

## 2009-04-06 ENCOUNTER — Ambulatory Visit: Payer: Self-pay | Admitting: Cardiology

## 2009-04-12 ENCOUNTER — Telehealth: Payer: Self-pay | Admitting: Cardiology

## 2009-04-20 ENCOUNTER — Encounter: Payer: Self-pay | Admitting: Internal Medicine

## 2009-04-25 ENCOUNTER — Encounter: Payer: Self-pay | Admitting: *Deleted

## 2009-04-25 ENCOUNTER — Ambulatory Visit: Payer: Self-pay | Admitting: Internal Medicine

## 2009-04-25 DIAGNOSIS — I442 Atrioventricular block, complete: Secondary | ICD-10-CM

## 2009-04-25 DIAGNOSIS — I495 Sick sinus syndrome: Secondary | ICD-10-CM

## 2009-04-25 DIAGNOSIS — I951 Orthostatic hypotension: Secondary | ICD-10-CM

## 2009-05-31 ENCOUNTER — Encounter (INDEPENDENT_AMBULATORY_CARE_PROVIDER_SITE_OTHER): Payer: Self-pay | Admitting: Cardiology

## 2009-05-31 ENCOUNTER — Ambulatory Visit: Payer: Self-pay | Admitting: Internal Medicine

## 2009-05-31 ENCOUNTER — Encounter: Payer: Self-pay | Admitting: *Deleted

## 2009-05-31 LAB — CONVERTED CEMR LAB
POC INR: 1.9
Prothrombin Time: 17.1 s

## 2009-06-28 ENCOUNTER — Ambulatory Visit: Payer: Self-pay | Admitting: Cardiology

## 2009-06-28 LAB — CONVERTED CEMR LAB
POC INR: 2.4
Prothrombin Time: 19 s

## 2009-07-04 ENCOUNTER — Ambulatory Visit: Payer: Self-pay | Admitting: Internal Medicine

## 2009-07-26 ENCOUNTER — Ambulatory Visit: Payer: Self-pay | Admitting: Internal Medicine

## 2009-07-26 ENCOUNTER — Encounter (INDEPENDENT_AMBULATORY_CARE_PROVIDER_SITE_OTHER): Payer: Self-pay | Admitting: Cardiology

## 2009-07-26 LAB — CONVERTED CEMR LAB: POC INR: 2.2

## 2009-08-23 ENCOUNTER — Ambulatory Visit: Payer: Self-pay | Admitting: Cardiovascular Disease

## 2009-08-23 LAB — CONVERTED CEMR LAB: POC INR: 3

## 2009-08-24 ENCOUNTER — Encounter: Payer: Self-pay | Admitting: Family Medicine

## 2009-09-20 ENCOUNTER — Ambulatory Visit: Payer: Self-pay | Admitting: Internal Medicine

## 2009-09-20 LAB — CONVERTED CEMR LAB: POC INR: 2.5

## 2009-10-03 ENCOUNTER — Ambulatory Visit: Payer: Self-pay | Admitting: Internal Medicine

## 2009-10-04 ENCOUNTER — Ambulatory Visit: Payer: Self-pay | Admitting: Cardiovascular Disease

## 2009-10-04 ENCOUNTER — Inpatient Hospital Stay (HOSPITAL_COMMUNITY): Admission: EM | Admit: 2009-10-04 | Discharge: 2009-10-05 | Payer: Self-pay | Admitting: Emergency Medicine

## 2009-10-04 ENCOUNTER — Encounter: Payer: Self-pay | Admitting: Cardiology

## 2009-10-11 ENCOUNTER — Telehealth: Payer: Self-pay | Admitting: Cardiology

## 2009-10-24 ENCOUNTER — Encounter: Payer: Self-pay | Admitting: Nurse Practitioner

## 2009-10-24 ENCOUNTER — Ambulatory Visit: Payer: Self-pay | Admitting: Cardiology

## 2009-10-24 ENCOUNTER — Encounter: Payer: Self-pay | Admitting: Cardiology

## 2009-11-03 ENCOUNTER — Ambulatory Visit: Payer: Self-pay | Admitting: Family Medicine

## 2009-11-13 ENCOUNTER — Encounter: Payer: Self-pay | Admitting: Cardiology

## 2009-11-14 ENCOUNTER — Telehealth (INDEPENDENT_AMBULATORY_CARE_PROVIDER_SITE_OTHER): Payer: Self-pay | Admitting: *Deleted

## 2009-11-14 ENCOUNTER — Encounter: Payer: Self-pay | Admitting: Cardiovascular Disease

## 2009-11-14 ENCOUNTER — Ambulatory Visit: Payer: Self-pay | Admitting: Internal Medicine

## 2009-11-14 ENCOUNTER — Ambulatory Visit: Payer: Self-pay

## 2009-11-14 LAB — CONVERTED CEMR LAB: POC INR: 2.7

## 2009-11-27 ENCOUNTER — Ambulatory Visit: Payer: Self-pay | Admitting: Cardiology

## 2009-11-27 DIAGNOSIS — R42 Dizziness and giddiness: Secondary | ICD-10-CM

## 2009-11-27 DIAGNOSIS — E7800 Pure hypercholesterolemia, unspecified: Secondary | ICD-10-CM

## 2009-11-28 ENCOUNTER — Telehealth (INDEPENDENT_AMBULATORY_CARE_PROVIDER_SITE_OTHER): Payer: Self-pay | Admitting: *Deleted

## 2009-11-28 ENCOUNTER — Ambulatory Visit (HOSPITAL_COMMUNITY): Admission: RE | Admit: 2009-11-28 | Discharge: 2009-11-28 | Payer: Self-pay | Admitting: Cardiology

## 2009-11-29 ENCOUNTER — Ambulatory Visit: Payer: Self-pay | Admitting: Cardiology

## 2009-11-29 ENCOUNTER — Ambulatory Visit (HOSPITAL_COMMUNITY): Admission: RE | Admit: 2009-11-29 | Discharge: 2009-11-29 | Payer: Self-pay | Admitting: Cardiology

## 2009-11-29 ENCOUNTER — Ambulatory Visit: Payer: Self-pay

## 2009-11-29 ENCOUNTER — Encounter: Payer: Self-pay | Admitting: Cardiology

## 2009-11-30 ENCOUNTER — Telehealth (INDEPENDENT_AMBULATORY_CARE_PROVIDER_SITE_OTHER): Payer: Self-pay | Admitting: *Deleted

## 2009-11-30 ENCOUNTER — Ambulatory Visit: Payer: Self-pay | Admitting: Family Medicine

## 2009-11-30 DIAGNOSIS — R05 Cough: Secondary | ICD-10-CM

## 2009-11-30 DIAGNOSIS — J019 Acute sinusitis, unspecified: Secondary | ICD-10-CM

## 2009-12-01 ENCOUNTER — Telehealth (INDEPENDENT_AMBULATORY_CARE_PROVIDER_SITE_OTHER): Payer: Self-pay | Admitting: *Deleted

## 2009-12-08 ENCOUNTER — Encounter (INDEPENDENT_AMBULATORY_CARE_PROVIDER_SITE_OTHER): Payer: Self-pay | Admitting: *Deleted

## 2009-12-13 ENCOUNTER — Ambulatory Visit: Payer: Self-pay | Admitting: Cardiovascular Disease

## 2009-12-13 LAB — CONVERTED CEMR LAB: POC INR: 4.2

## 2009-12-27 ENCOUNTER — Ambulatory Visit: Payer: Self-pay | Admitting: Internal Medicine

## 2009-12-27 LAB — CONVERTED CEMR LAB: POC INR: 2

## 2010-01-24 ENCOUNTER — Ambulatory Visit: Payer: Self-pay | Admitting: Cardiology

## 2010-01-24 LAB — CONVERTED CEMR LAB: POC INR: 2.2

## 2010-02-04 ENCOUNTER — Ambulatory Visit: Payer: Self-pay | Admitting: Internal Medicine

## 2010-02-21 ENCOUNTER — Telehealth (INDEPENDENT_AMBULATORY_CARE_PROVIDER_SITE_OTHER): Payer: Self-pay | Admitting: *Deleted

## 2010-02-21 ENCOUNTER — Encounter (INDEPENDENT_AMBULATORY_CARE_PROVIDER_SITE_OTHER): Payer: Self-pay | Admitting: *Deleted

## 2010-02-21 ENCOUNTER — Ambulatory Visit: Payer: Self-pay | Admitting: Cardiovascular Disease

## 2010-03-09 ENCOUNTER — Ambulatory Visit: Payer: Self-pay | Admitting: Cardiology

## 2010-03-09 ENCOUNTER — Encounter: Payer: Self-pay | Admitting: Internal Medicine

## 2010-03-21 ENCOUNTER — Ambulatory Visit: Payer: Self-pay | Admitting: Cardiology

## 2010-03-21 ENCOUNTER — Ambulatory Visit: Payer: Self-pay | Admitting: Internal Medicine

## 2010-03-21 LAB — CONVERTED CEMR LAB: POC INR: 3

## 2010-04-18 ENCOUNTER — Ambulatory Visit: Payer: Self-pay | Admitting: Internal Medicine

## 2010-04-18 LAB — CONVERTED CEMR LAB: POC INR: 2.5

## 2010-04-30 ENCOUNTER — Ambulatory Visit: Payer: Self-pay | Admitting: Family Medicine

## 2010-05-03 ENCOUNTER — Ambulatory Visit: Payer: Self-pay | Admitting: Cardiology

## 2010-05-07 LAB — CONVERTED CEMR LAB
BUN: 31 mg/dL — ABNORMAL HIGH (ref 6–23)
CO2: 32 meq/L (ref 19–32)
Calcium: 9.5 mg/dL (ref 8.4–10.5)
Creatinine, Ser: 1.3 mg/dL — ABNORMAL HIGH (ref 0.4–1.2)
Glucose, Bld: 93 mg/dL (ref 70–99)
Potassium: 4.7 meq/L (ref 3.5–5.1)
Sodium: 143 meq/L (ref 135–145)

## 2010-05-08 ENCOUNTER — Ambulatory Visit: Payer: Self-pay | Admitting: Family Medicine

## 2010-05-15 ENCOUNTER — Ambulatory Visit: Payer: Self-pay | Admitting: Internal Medicine

## 2010-05-15 ENCOUNTER — Ambulatory Visit: Payer: Self-pay | Admitting: Cardiology

## 2010-05-15 LAB — CONVERTED CEMR LAB: POC INR: 3.4

## 2010-06-06 ENCOUNTER — Ambulatory Visit: Payer: Self-pay | Admitting: Internal Medicine

## 2010-06-20 ENCOUNTER — Ambulatory Visit: Payer: Self-pay | Admitting: Cardiology

## 2010-08-07 ENCOUNTER — Ambulatory Visit: Payer: Self-pay | Admitting: Cardiology

## 2010-08-07 ENCOUNTER — Ambulatory Visit: Payer: Self-pay | Admitting: Cardiovascular Disease

## 2010-08-07 LAB — CONVERTED CEMR LAB: POC INR: 2.6

## 2010-08-23 ENCOUNTER — Encounter: Payer: Self-pay | Admitting: Family Medicine

## 2010-09-12 ENCOUNTER — Ambulatory Visit: Payer: Self-pay | Admitting: Internal Medicine

## 2010-09-25 ENCOUNTER — Encounter: Payer: Self-pay | Admitting: Family Medicine

## 2010-09-27 ENCOUNTER — Encounter (INDEPENDENT_AMBULATORY_CARE_PROVIDER_SITE_OTHER): Payer: Self-pay | Admitting: *Deleted

## 2010-10-10 ENCOUNTER — Ambulatory Visit: Payer: Self-pay | Admitting: Cardiology

## 2010-10-29 ENCOUNTER — Ambulatory Visit: Payer: Self-pay | Admitting: Internal Medicine

## 2010-10-29 ENCOUNTER — Ambulatory Visit: Payer: Self-pay | Admitting: Cardiology

## 2010-11-07 ENCOUNTER — Ambulatory Visit: Payer: Self-pay | Admitting: Cardiology

## 2010-11-07 LAB — CONVERTED CEMR LAB
CO2: 30 meq/L (ref 19–32)
Chloride: 104 meq/L (ref 96–112)
Glucose, Bld: 91 mg/dL (ref 70–99)
Potassium: 4.5 meq/L (ref 3.5–5.1)
Sodium: 141 meq/L (ref 135–145)

## 2010-11-23 ENCOUNTER — Encounter: Payer: Self-pay | Admitting: Cardiology

## 2010-11-27 ENCOUNTER — Ambulatory Visit
Admission: RE | Admit: 2010-11-27 | Discharge: 2010-11-27 | Payer: Self-pay | Source: Home / Self Care | Attending: Cardiology | Admitting: Cardiology

## 2010-11-27 ENCOUNTER — Ambulatory Visit: Admission: RE | Admit: 2010-11-27 | Discharge: 2010-11-27 | Payer: Self-pay | Source: Home / Self Care

## 2010-11-27 ENCOUNTER — Encounter: Payer: Self-pay | Admitting: Cardiology

## 2010-12-04 LAB — CONVERTED CEMR LAB
CO2: 25 meq/L (ref 19–32)
Calcium: 9.1 mg/dL (ref 8.4–10.5)
Chloride: 106 meq/L (ref 96–112)
GFR calc non Af Amer: 41.33 mL/min — ABNORMAL LOW (ref >60.00)
Glucose, Bld: 108 mg/dL — ABNORMAL HIGH (ref 70–99)

## 2010-12-11 ENCOUNTER — Ambulatory Visit: Admission: RE | Admit: 2010-12-11 | Discharge: 2010-12-11 | Payer: Self-pay | Source: Home / Self Care

## 2010-12-11 LAB — CONVERTED CEMR LAB: POC INR: 2.9

## 2010-12-23 LAB — CONVERTED CEMR LAB
BUN: 19 mg/dL (ref 6–23)
BUN: 28 mg/dL — ABNORMAL HIGH (ref 6–23)
Basophils Absolute: 0.1 10*3/uL (ref 0.0–0.1)
Basophils Relative: 0.8 % (ref 0.0–3.0)
CO2: 29 meq/L (ref 19–32)
CO2: 31 meq/L (ref 19–32)
Calcium: 10.1 mg/dL (ref 8.4–10.5)
Calcium: 9.6 mg/dL (ref 8.4–10.5)
Chloride: 102 meq/L (ref 96–112)
Chloride: 103 meq/L (ref 96–112)
Creatinine, Ser: 1.3 mg/dL — ABNORMAL HIGH (ref 0.4–1.2)
Creatinine, Ser: 1.3 mg/dL — ABNORMAL HIGH (ref 0.4–1.2)
Eosinophils Absolute: 0.2 10*3/uL (ref 0.0–0.7)
Eosinophils Relative: 1.9 % (ref 0.0–5.0)
GFR calc non Af Amer: 41.81 mL/min (ref >60)
GFR calc non Af Amer: 41.87 mL/min (ref >60)
Glucose, Bld: 85 mg/dL (ref 70–99)
Glucose, Bld: 93 mg/dL (ref 70–99)
HCT: 41.4 % (ref 36.0–46.0)
Hemoglobin: 13.9 g/dL (ref 12.0–15.0)
Lymphocytes Relative: 26.5 % (ref 12.0–46.0)
Lymphs Abs: 2.1 10*3/uL (ref 0.7–4.0)
MCHC: 33.5 g/dL (ref 30.0–36.0)
MCV: 96.3 fL (ref 78.0–100.0)
Monocytes Absolute: 0.8 10*3/uL (ref 0.1–1.0)
Monocytes Relative: 10.1 % (ref 3.0–12.0)
Neutro Abs: 4.7 10*3/uL (ref 1.4–7.7)
Neutrophils Relative %: 60.7 % (ref 43.0–77.0)
Platelets: 238 10*3/uL (ref 150.0–400.0)
Potassium: 4.1 meq/L (ref 3.5–5.1)
Potassium: 4.9 meq/L (ref 3.5–5.1)
RBC: 4.3 M/uL (ref 3.87–5.11)
RDW: 14.5 % (ref 11.5–14.6)
Sodium: 139 meq/L (ref 135–145)
Sodium: 139 meq/L (ref 135–145)
TSH: 0.93 microintl units/mL (ref 0.35–5.50)
WBC: 7.9 10*3/uL (ref 4.5–10.5)

## 2010-12-27 NOTE — Medication Information (Signed)
Summary: rov/ln  Anticoagulant Therapy  Managed by: Weston Brass, PharmD Referring MD: Shawnie Pons MD PCP: Loreen Freud DO Supervising MD: Daleen Squibb MD, Maisie Fus Indication 1: Atrial Fibrillation (ICD-427.31) Lab Used: [facility] [place] Site: [address] INR POC 2.5 INR RANGE 2 - 3  Dietary changes: no    Health status changes: no    Bleeding/hemorrhagic complications: no    Recent/future hospitalizations: no    Any changes in medication regimen? no    Recent/future dental: no  Any missed doses?: no       Is patient compliant with meds? yes       Allergies: 1)  ! Pcn 2)  ! Trandate (Labetalol Hcl) 3)  ! Hytrin 4)  ! Lotrel (Amlodipine Besy-Benazepril Hcl) 5)  ! Codeine 6)  ! Levaquin (Levofloxacin) 7)  ! Keflex 8)  Amoxicillin (Amoxicillin) 9)  Codeine Phosphate (Codeine Phosphate) 10)  * Hytrin (Terazosin) 11)  Niaspan (Niacin (Antihyperlipidemic))  Anticoagulation Management History:      The patient is taking warfarin and comes in today for a routine follow up visit.  Positive risk factors for bleeding include an age of 75 years or older.  The bleeding index is 'intermediate risk'.  Positive CHADS2 values include History of HTN and Age > 75 years old.  The start date was 01/10/2003.  Anticoagulation responsible provider: Daleen Squibb MD, Maisie Fus.  INR POC: 2.5.  Cuvette Lot#: [id number].  Exp: 08/2011.    Anticoagulation Management Assessment/Plan:      The patient's current anticoagulation dose is Coumadin 2 mg tabs: Take as directed by coumadin clinic..  The target INR is 2 - 3.  The next INR is due 07/18/2010.  Anticoagulation instructions were given to patient.  Results were reviewed/authorized by Weston Brass, PharmD.  She was notified by Dillard Cannon.         Prior Anticoagulation Instructions: INR 1.4  Take 2 tabs today and tomorrow.  Then resume same regimen of 1.5 tabs on Wednesday and 1 tab all other days.  Re-check INR in   Current Anticoagulation  Instructions: INR 2.5  Continue same dose of 1 tab daily except for 1.5 tabs on Wednesday.  Re-check in 4 weeks.

## 2010-12-27 NOTE — Assessment & Plan Note (Signed)
Summary: ROV   Visit Type:  Follow-up Primary Provider:  Loreen Freud DO  CC:  dizziness and nausea.  History of Present Illness: Patient went to the hospital with an episode of chest pain, and underwent cardiac cath study.  Etiology of this was unclear.  NTG help.  They changed her BP meds, and she has not been feeling well since she went home.  She then develop chest congestion.  She was dizzy.  She has been nauseated since that happen.  She is now seeing Dr. Laury Axon.  She says she has just not bounced back from the hospitalization.  Her dizziness seems to occur primarily when she moves her head.  Her nausea is similar at present.  Current Medications (verified): 1)  Coumadin 2 Mg Tabs (Warfarin Sodium) .... Take As Directed By Coumadin Clinic. 2)  Sertraline Hcl 100 Mg Tabs (Sertraline Hcl) .[name].. 1 By Mouth Qd 3)  Diovan 160 Mg Tabs (Valsartan) .[name].. 1 By Mouth Once Daily 4)  Carbidopa-Levodopa 25-100 Mg  Tabs (Carbidopa-Levodopa) .... Take One and One Half Tablet Every Morning, One and One Half At [facility], One Half Tablet Every Night At Bedtime 5)  Synthroid 88 Mcg  Tabs (Levothyroxine Sodium) .[name].. 1 By Mouth Once Daily**labs Due Now** 6)  Norvasc 5 Mg Tabs (Amlodipine Besylate) .... Take One Daily 7)  Co Q 10 8)  Adult Aspirin Ec Low Strength 81 Mg Tbec (Aspirin) .... Take One Tablet Once Daily 9)  Folic Acid 400 Mcg Tabs (Folic Acid) .... Take 1 By Mouth Once Daily 10)  Calcium 1200mg  11)  Refresh Eye Qtts .... Prn 12)  Nitrostat 0.4 Mg Subl (Nitroglycerin) .[name].. 1 Tablet Under Tongue At Onset of Chest Pain; You May Repeat Every 5 Minutes For Up To 3 Doses. 13)  Famotidine 20 Mg Tabs (Famotidine) .... Take 1 By Mouth As Needed 14)  Potassium Gluconate 595 Mg Cr-Tabs (Potassium Gluconate) .... Take 1 By Mouth Once Daily 15)  Hydrochlorothiazide 25 Mg Tabs (Hydrochlorothiazide) .... Take As Needed 16)  Nasacort Aq 55 Mcg/act Aers (Triamcinolone Acetonide(Nasal)) .... 2 Sprays Each Nostril Once  Daily  Allergies: 1)  ! Pcn 2)  ! Trandate (Labetalol Hcl) 3)  ! Hytrin 4)  ! Lotrel (Amlodipine Besy-Benazepril Hcl) 5)  ! Codeine 6)  ! Levaquin (Levofloxacin) 7)  ! Keflex 8)  Amoxicillin (Amoxicillin) 9)  Codeine Phosphate (Codeine Phosphate) 10)  * Hytrin (Terazosin) 11)  Niaspan (Niacin (Antihyperlipidemic))  Vital Signs:  Patient profile:   77 year old female Height:      61 inches Weight:      136 pounds Pulse rate:   104 / minute Pulse rhythm:   regular BP supine:   170 / 70 BP sitting:   130 / 78  (right arm) BP standing:   170 / 70  Vitals Entered By: Jacquelin Hawking, CMA (November 27, 2009 10:36 AM)  Physical Exam  General:  Well developed, well nourished, in no acute distress. Head:  Mnimal tenderness over the max sinus area, but not specific. Neck:  Soft bilateral carotid bruits.  Pulses ok bilaterally. Lungs:  Clear bilaterally to auscultation and percussion. Heart:  Soft SEM. Abdomen:  Bowel sounds positive; abdomen soft and non-tender without masses, organomegaly, or hernias noted. No hepatosplenomegaly. Extremities:  No clubbing or cyanosis. Neurologic:  Alert and oriented x 3.   EKG  Procedure date:  11/27/2009  Findings:      AV pacing.  (rhythm is not afib).  Cardiac Cath  Procedure date:  10/06/2009  Findings:      ASSESSMENT: 1. Severe native three-vessel coronary artery disease with left main     stenosis, severe right coronary artery and left circumflex     stenoses, and moderate left anterior descending artery stenosis. 2. Status post coronary artery bypass surgery with continued patency     of the saphenous vein graft sequence to posterior descending artery     and posterolateral branches of the right coronary artery, saphenous     vein graft to obtuse marginal, and left internal mammary artery to     left anterior descending artery.   PLAN:  Ms. Migliaccio appears to have stable coronary anatomy with patent bypass grafts.  Recommend  continued medical therapy.         Veverly Fells. Excell Seltzer, MD  [facility] Specifications Following MD:  Sherryl Manges, MD     PPM Vendor:  [facility]     PPM Model Number:  1291     PPM Serial Number:  [id number] PPM DOI:  01/01/2005      Lead 1    Location: RA     DOI: 05/01/1998     Model #: 6940     Serial #: [id number]     Status: active Lead 2    Location: RV     DOI: 05/01/1998     Model #: 1346     Serial #: [id number]     Status: active   Indications:  SINCE NODE ARREST      PPM Follow Up Remote Check?  No Battery Voltage:  MOL V     Battery Est. Longevity:  3.5 YEARS     Pacer Dependent:  Yes       PPM Device Measurements Atrium  Amplitude: 2.2 mV, Impedance: 720 ohms, Threshold: 1.0 V at 0.4 msec Right Ventricle  Amplitude: PACED AT 30 mV, Impedance: 830 ohms, Threshold: 1.1 V at 0.4 msec  Episodes MS Episodes:  5     Percent Mode Switch:  0%     Coumadin:  Yes Ventricular High Rate:  0     Atrial Pacing:  87%     Ventricular Pacing:  99%  Parameters Mode:  DDDR     Lower Rate Limit:  60     Upper Rate Limit:  120 Paced AV Delay:  300     Sensed AV Delay:  250 Next Cardiology Appt Due:  05/25/2010 Tech Comments:  Normal device function.  Longest mode switch episode 3.5 minutes.  No changes made today.  Pt does TTM's with [employer].  ROV 6 months SK. Gypsy Balsam RN BSN  November 27, 2009 10:53 AM   Impression & Recommendations:  Problem # 1:  CORONARY ARTERY DISEASE (ICD-414.00) Grafts remain patent, and stable from cardiac standpoint.  Problem # 2:  DIZZINESS (ICD-780.4) Etiology of this is unclear.  It is not orthostatic, and vitals appear stable.  It appears to be related to head movement, and could be related to positional vertigo.  Sinusitis could also contribute.  Two recent rounds of antibiotics from Dr. Laury Axon.  Will check sinus films with followup with Dr. Laury Axon. Orders: EKG w/ Interpretation (93000) TLB-BMP (Basic Metabolic Panel-BMET) (80048-METABOL) TLB-CBC Platelet -  w/Differential (85025-CBCD) TLB-TSH (Thyroid Stimulating Hormone) (84443-TSH) Echocardiogram (Echo) Misc. Referral (Misc. Ref)  Problem # 3:  HYPOTENSION, ORTHOSTATIC (ICD-458.0) No change today. BPs here much higher than at home when regularly checked.  She thinks things were messed up in hospital, although medication  changes are mild, and changes seem appropriate. Orders: Echocardiogram (Echo)  Problem # 4:  HYPOTHYROIDISM (ICD-244.9) recheck levels. Her updated medication list for this problem includes:    Synthroid 88 Mcg Tabs (Levothyroxine sodium) .[name]... 1 by mouth once daily**labs due now**  Problem # 5:  ATRIAL FIBRILLATION (ICD-427.31) Currently in sinus rhythm. Her updated medication list for this problem includes:    Coumadin 2 Mg Tabs (Warfarin sodium) .[name]... Take as directed by coumadin clinic.    Adult Aspirin Ec Low Strength 81 Mg Tbec (Aspirin) .[name]... Take one tablet once daily  Problem # 6:  DEPRESSION (ICD-311) question some component of this.  Problem # 7:  HYPERCHOLESTEROLEMIA  IIA (ICD-272.0) cannot tolerate statins at present.  Does not want to take.  See hospital levels.  Patient Instructions: 1)  Your physician recommends that you schedule a follow-up appointment in: 6 MONTHS with Dr Graciela Husbands, 2 MONTHS with Dr Riley Kill 2)  Please schedule a follow-up appointment with Dr Laury Axon for evaluation of dizziness. 3)  Sinus films will be obtained. 4)  Your physician recommends that you have lab work today: CBC, BMP, TSH 5)  Your physician recommends that you continue on your current medications as directed. Please refer to the Current Medication list given to you today. 6)  Your physician has requested that you have an echocardiogram.  Echocardiography is a painless test that uses sound waves to create images of your heart. It provides your doctor with information about the size and shape of your heart and how well your heart's chambers and valves are working.  This procedure  takes approximately one hour. There are no restrictions for this procedure. Prescriptions: NORVASC 5 MG TABS (AMLODIPINE BESYLATE) take one daily  #90 x 3   Entered by:   Julieta Gutting, RN, BSN   Authorized by:   Ronaldo Miyamoto, MD, [facility]   Signed by:   Julieta Gutting, RN, BSN on 11/27/2009   Method used:   Electronically to        [facility] # [id number]* (retail)       [address]       [place], [place]  [address]       Ph: [phone] or [phone]       Fax: [phone]   RxID:   [id number]

## 2010-12-27 NOTE — Assessment & Plan Note (Signed)
Summary: cough-congested/cbs   Vital Signs:  Patient profile:   77 year old female Weight:      140 pounds Temp:     98.3 degrees F oral Pulse (ortho):   72 / minute BP sitting:   142 / 80  (left arm) Cuff size:   regular  Vitals Entered By: Army Fossa CMA (April 30, 2010 1:30 PM) CC: Pt here c/o chest congestion x 2 days. Has tried Mucinex. no relief.   Allergies: 1)  ! Pcn 2)  ! Trandate (Labetalol Hcl) 3)  ! Hytrin 4)  ! Lotrel (Amlodipine Besy-Benazepril Hcl) 5)  ! Codeine 6)  ! Levaquin (Levofloxacin) 7)  ! Keflex 8)  Amoxicillin (Amoxicillin) 9)  Codeine Phosphate (Codeine Phosphate) 10)  * Hytrin (Terazosin) 11)  Niaspan (Niacin (Antihyperlipidemic))  Physical Exam  General:  Well-developed,well-nourished,in no acute distress; alert,appropriate and cooperative throughout examination Ears:  External ear exam shows no significant lesions or deformities.  Otoscopic examination reveals clear canals, tympanic membranes are intact bilaterally without bulging, retraction, inflammation or discharge. Hearing is grossly normal bilaterally. Nose:  External nasal examination shows no deformity or inflammation. Nasal mucosa are pink and moist without lesions or exudates. Mouth:  Oral mucosa and oropharynx without lesions or exudates.  Teeth in good repair. Neck:  No deformities, masses, or tenderness noted. Lungs:  R decreased breath sounds and L decreased breath sounds.   Heart:  normal rate.   Psych:  Oriented X3 and normally interactive.     Impression & Recommendations:  Problem # 1:  BRONCHITIS- ACUTE (ICD-466.0)  Her updated medication list for this problem includes:    Zithromax Z-pak 250 Mg Tabs (Azithromycin) .[name]... As directed     Mucinex  Take antibiotics and other medications as directed. Encouraged to push clear liquids, get enough rest, and take acetaminophen as needed. To be seen in 5-7 days if no improvement, sooner if worse.  Complete Medication  List: 1)  Coumadin 2 Mg Tabs (Warfarin sodium) .... Take as directed by coumadin clinic. 2)  Sertraline Hcl 100 Mg Tabs (Sertraline hcl) .[name].. 1 by mouth qd 3)  Diovan 320 Mg Tabs (Valsartan) .... Take 1/2 tablet daily 4)  Carbidopa-levodopa 25-100 Mg Tabs (Carbidopa-levodopa) .... Take one and one half tablet every morning, one and one half at noon, one half tablet every night at bedtime 5)  Synthroid 88 Mcg Tabs (Levothyroxine sodium) .[name].. 1 by mouth once daily 6)  Norvasc 5 Mg Tabs (Amlodipine besylate) .... Take one daily 7)  Adult Aspirin Ec Low Strength 81 Mg Tbec (Aspirin) .... Take one tablet once daily 8)  Folic Acid 400 Mcg Tabs (Folic acid) .... Take 1 by mouth once daily 9)  Nitrostat 0.4 Mg Subl (Nitroglycerin) .[name].. 1 tablet under tongue at onset of chest pain; you may repeat every 5 minutes for up to 3 doses. 10)  Potassium Gluconate 595 Mg Cr-tabs (Potassium gluconate) .... Take 1 by mouth once daily 11)  Metoprolol Tartrate 25 Mg Tabs (Metoprolol tartrate) .... Take one tablet by mouth twice a day 12)  Zithromax Z-pak 250 Mg Tabs (Azithromycin) .... As directed Prescriptions: ZITHROMAX Z-PAK 250 MG TABS (AZITHROMYCIN) as directed  #1 x 0   Entered and Authorized by:   Loreen Freud DO   Signed by:   Loreen Freud DO on 04/30/2010   Method used:   Electronically to        [facility] # [id number]* (retail)  [address]       [place], [place]  [address]       Ph: [phone] or [phone]       Fax: [phone]   RxID:   [id number]

## 2010-12-27 NOTE — Assessment & Plan Note (Signed)
Summary: pacer [email or web address]   Primary Provider:  Loreen Freud DO  CC:  pacer check.  Pt feeling tired. [name]  History of Present Illness:  [name] is seen in followup for pacemaker generator implantation for sinus dysfunction with subsequent development of antegrade complete heart block.  She also has coronary artery disease with prior bypass grafting;   echo in January 2011 demonstrated normal left ventricular function.  her major complaint has been fatigue. She dates this back to when she was in the hospital in December. At that time her beta blocker with atenolol which had to be stopped because of dizziness. She was put on metoprolol in hospital; the prescription was not written at the time of discharge and Dr. Riley Kill subsequently started her on tartrate 25 twice daily.  she is also currently under therapy for bronchitis  Current Medications (verified): 1)  Coumadin 2 Mg Tabs (Warfarin Sodium) .... Take As Directed By Coumadin Clinic. 2)  Sertraline Hcl 100 Mg Tabs (Sertraline Hcl) .[name].. 1 By Mouth Qd 3)  Diovan 320 Mg Tabs (Valsartan) .... Take 1/2 Tablet Daily 4)  Carbidopa-Levodopa 25-100 Mg  Tabs (Carbidopa-Levodopa) .... Take One and One Half Tablet Every Morning, One and One Half At [facility], One Half Tablet Every Night At Bedtime 5)  Synthroid 88 Mcg  Tabs (Levothyroxine Sodium) .[name].. 1 By Mouth Once Daily 6)  Norvasc 5 Mg Tabs (Amlodipine Besylate) .... Take One Daily 7)  Adult Aspirin Ec Low Strength 81 Mg Tbec (Aspirin) .... Take One Tablet Once Daily 8)  Folic Acid 400 Mcg Tabs (Folic Acid) .... Take 1 By Mouth Once Daily 9)  Nitrostat 0.4 Mg Subl (Nitroglycerin) .[name].. 1 Tablet Under Tongue At Onset of Chest Pain; You May Repeat Every 5 Minutes For Up To 3 Doses. 10)  Potassium Gluconate 595 Mg Cr-Tabs (Potassium Gluconate) .... Take 1 By Mouth Once Daily 11)  Metoprolol Tartrate 25 Mg Tabs (Metoprolol Tartrate) .... Take One Tablet By Mouth Twice A Day 12)  Doxycycline  Hyclate 100 Mg Caps (Doxycycline Hyclate) .[name].. 1 By Mouth Two Times A Day 13)  Veramyst 27.5 Mcg/spray Susp (Fluticasone Furoate) .... 2 Sprays Each Nostril Once Daily  Allergies (verified): 1)  ! Pcn 2)  ! Trandate (Labetalol Hcl) 3)  ! Hytrin 4)  ! Lotrel (Amlodipine Besy-Benazepril Hcl) 5)  ! Codeine 6)  ! Levaquin (Levofloxacin) 7)  ! Keflex 8)  Amoxicillin (Amoxicillin) 9)  Codeine Phosphate (Codeine Phosphate) 10)  * Hytrin (Terazosin) 11)  Niaspan (Niacin (Antihyperlipidemic))  Past History:  Past Medical History: Last updated: 03/20/2010 Current Problems:  ATRIAL FIBRILLATION (ICD-427.31) CORONARY ARTERY DISEASE (ICD-414.00) CORONARY ARTERY BYPASS GRAFT, HX OF (ICD-V45.81) PACEMAKER, PERMANENT-GUIDANT INSIGNIA 1291 (ICD-V45.01) HYPOTENSION, ORTHOSTATIC (ICD-458.0) HYPERTENSION (ICD-401.9) HYPERLIPIDEMIA (ICD-272.4) CAROTID BRUIT, RIGHT (ICD-785.9) RENAL INSUFFICIENCY (ICD-588.9) DEPRESSION (ICD-311) ANXIETY (ICD-300.00) AV BLOCK, COMPLETE (ICD-426.0) SINOATRIAL NODE DYSFUNCTION (ICD-427.81) DIZZINESS (ICD-780.4) HYPERCHOLESTEROLEMIA  IIA (ICD-272.0) COUGH (ICD-786.2) SINUSITIS - ACUTE-NOS (ICD-461.9) CATARACT EXTRACTION, HX OF (ICD-V45.61) POLYPECTOMY, HX OF (ICD-V15.9) PARKINSON'S DISEASE (ICD-332.0) * RIGHT L4-5 FAR-LATERAL METRIX MICRODISCECTOMY HYPOTHYROIDISM (ICD-244.9) LOW BACK PAIN SYNDROME (ICD-724.2) FEMORAL BRUIT (ICD-785.9) SLEEP APNEA (ICD-780.57) DIVERTICULOSIS, COLON (ICD-562.10) ANEMIA-NOS (ICD-285.9)  Past Surgical History: Last updated: 03/20/2010 CORONARY ARTERY BYPASS GRAFT, HX OF (ICD-V45.81) PACEMAKER, PERMANENT (ICD-V45.01) CATARACT EXTRACTION, HX OF (ICD-V45.61) POLYPECTOMY, HX OF (ICD-V15.9 * RIGHT L4-5 FAR-LATERAL METRIX MICRODISCECTOMY  Family History: Last updated: 03/20/2010   Both mother and father lived into their 54s.  One brother deceased who has congestive heart failure.  Three sisters, one has emphysema, one  has  osteoporosis, one with Alzheimer's, but all still living in their 80s.  Social History: Last updated: 03/20/2010  The patient is a retired Engineer, civil (consulting).  She does not smoke, does  not partake of alcoholic beverages, very active in cardiac rehabilitation.  Vital Signs:  Patient profile:   77 year old female Height:      61 inches Weight:      137 pounds BMI:     25.98 Pulse rate:   74 / minute Pulse rhythm:   regular BP sitting:   152 / 72  (left arm) Cuff size:   regular  Vitals Entered By: Judithe Modest CMA (May 15, 2010 12:15 PM)  Physical Exam  General:  Well-developed,well-nourished,in no acute distress; alert,appropriate and cooperative throughout examination Lungs:  Clear bilaterally to auscultation and percussion. Heart:  rrate and rhythm without murmurs or gallops Abdomen:  sand nontender Extremities:  no clubbing cyanosis or edema Neurologic:  grossly normal Skin:  warm and dry   PPM Specifications Following MD:  Sherryl Manges, MD     PPM Vendor:  [employer]     PPM Model Number:  1191     PPM Serial Number:  [id number] PPM DOI:  01/01/2005      Lead 1    Location: RA     DOI: 05/01/1998     Model #: 6940     Serial #: [id number]     Status: active Lead 2    Location: RV     DOI: 05/01/1998     Model #: 1346     Serial #: [id number]     Status: active   Indications:  SINCE NODE ARREST      PPM Follow Up Battery Voltage:  GOOD V     Battery Est. Longevity:  3.5 YRS     Pacer Dependent:  Yes       PPM Device Measurements Atrium  Amplitude: PACED mV, Impedance: 720 ohms, Threshold: 0.6 V at 0.40 msec Right Ventricle  Amplitude: PACED mV, Impedance: 700 ohms, Threshold: 0.40 V at 0.4 msec  Episodes MS Episodes:  0     Percent Mode Switch:  0     Coumadin:  Yes Ventricular High Rate:  0     Atrial Pacing:  85%     Ventricular Pacing:  100%  Parameters Mode:  DDDR     Lower Rate Limit:  60     Upper Rate Limit:  120 Paced AV Delay:  300     Sensed AV Delay:  250 Next  Cardiology Appt Due:  10/25/2010 Tech Comments:  NO PWAVES OR RWAVES ON TODAYS CHECK.  NORMAL THRESHOLD TESTING.  CHANGED A AMPLITUDE FROM 2.50 TO 2.00V.  ROV IN 6 MTHS. Vella Kohler  May 15, 2010 12:43 PM  Impression & Recommendations:  Problem # 1:  ATRIAL FIBRILLATION (ICD-427.31) She continues to have atrial fibrillation according to her device; however, investigation of e grams and states that this is far field oversensing.  with her complete heart block there is no impact on ventricular rates  Problem # 2:  FATIGUE (ICD-780.79) I wonder whether the fatigue is related to metoprolol tartrate. I have given her prescriptions for metoprolol succinate and Inderal LA 80 as an alternative to see whether the fatigue resolves. She is not tolerant of atenolol  Problem # 3:  AV BLOCK, COMPLETE (ICD-426.0) able post pacemaker implantation Her updated medication list for this problem includes:    Coumadin 2 Mg Tabs (Warfarin sodium) .Marland KitchenMarland KitchenMarland KitchenMarland Kitchen  Take as directed by coumadin clinic.    Norvasc 5 Mg Tabs (Amlodipine besylate) .[name]... Take one daily    Adult Aspirin Ec Low Strength 81 Mg Tbec (Aspirin) .[name]... Take one tablet once daily    Nitrostat 0.4 Mg Subl (Nitroglycerin) .[name]... 1 tablet under tongue at onset of chest pain; you may repeat every 5 minutes for up to 3 doses.    Metoprolol Succinate 25 Mg Xr24h-tab (Metoprolol succinate) .[name]... Take one tablet by mouth daily    Propranolol Hcl Cr 80 Mg Xr24h-cap (Propranolol hcl) .[name]... Take 1 tablet by mouth once a day  Problem # 4:  PACEMAKER, PERMANENT-GUIDANT INSIGNIA 1291 (ICD-V45.01) Device parameters and data were reviewed and no changes were made  Problem # 5:  SINOATRIAL NODE DYSFUNCTION (ICD-427.81) sinus node dysfunction with adequate rate response generated by her pacemaker  Patient Instructions: 1)  Your physician has recommended you make the following change in your medication: Try Metoprolol Succinate and Propranolol instead of Metoprolol  Tartrate one at a time.   2)  Your physician recommends that you schedule a follow-up appointment in: 6 months with device clinic. Prescriptions: PROPRANOLOL HCL CR 80 MG XR24H-CAP (PROPRANOLOL HCL) Take 1 tablet by mouth once a day  #30 x 11   Entered by:   Optometrist BSN   Authorized by:   Nathen May, MD, [facility]   Signed by:   Gypsy Balsam RN BSN on 05/15/2010   Method used:   Electronically to        [id number]* (retail)       [address]       [place], [place]  [address]       Ph: [phone] or [phone]       Fax: [phone]   RxID:   [id number] METOPROLOL SUCCINATE 25 MG XR24H-TAB (METOPROLOL SUCCINATE) Take one tablet by mouth daily  #30 x 11   Entered by:   Optometrist BSN   Authorized by:   Nathen May, MD, [facility]   Signed by:   Gypsy Balsam RN BSN on 05/15/2010   Method used:   Electronically to        [facility] # [id number]* (retail)       [address]       [place], [place]  [address]       Ph: [phone] or [phone]       Fax: [phone]   RxID:   [id number]

## 2010-12-27 NOTE — Cardiovascular Report (Signed)
Summary: Office Visit   Office Visit   Imported By: Roderic Ovens 11/30/2009 16:17:36  _____________________________________________________________________  External Attachment:    Type:   Image     Comment:   External Document

## 2010-12-27 NOTE — Medication Information (Signed)
Summary: rov/ewj  Anticoagulant Therapy  Managed by: Cloyde Reams, RN, BSN Referring MD: Shawnie Pons MD PCP: Loreen Freud DO Supervising MD: Riley Kill MD, Maisie Fus Indication 1: Atrial Fibrillation (ICD-427.31) Lab Used: [facility]  Site: [address] INR POC 2.2 INR RANGE 2 - 3  Dietary changes: no    Health status changes: no    Bleeding/hemorrhagic complications: no    Recent/future hospitalizations: no    Any changes in medication regimen? no    Recent/future dental: no  Any missed doses?: no       Is patient compliant with meds? yes       Allergies: 1)  ! Pcn 2)  ! Trandate (Labetalol Hcl) 3)  ! Hytrin 4)  ! Lotrel (Amlodipine Besy-Benazepril Hcl) 5)  ! Codeine 6)  ! Levaquin (Levofloxacin) 7)  ! Keflex 8)  Amoxicillin (Amoxicillin) 9)  Codeine Phosphate (Codeine Phosphate) 10)  * Hytrin (Terazosin) 11)  Niaspan (Niacin (Antihyperlipidemic))  Anticoagulation Management History:      The patient is taking warfarin and comes in today for a routine follow up visit.  Positive risk factors for bleeding include an age of 74 years or older.  The bleeding index is 'intermediate risk'.  Positive CHADS2 values include History of HTN and Age > 26 years old.  The start date was 01/10/2003.  Anticoagulation responsible provider: Riley Kill MD, Maisie Fus.  INR POC: 2.2.  Cuvette Lot#: [id number].  Exp: 03/2011.    Anticoagulation Management Assessment/Plan:      The patient's current anticoagulation dose is Coumadin 2 mg tabs: Take as directed by coumadin clinic..  The target INR is 2 - 3.  The next INR is due 02/21/2010.  Anticoagulation instructions were given to patient.  Results were reviewed/authorized by Cloyde Reams, RN, BSN.  She was notified by Cloyde Reams RN.         Prior Anticoagulation Instructions: INR 2.0  Continue on same dosage 1 tablet daily except 1.5 tablets on Wednesdays.  Recheck in 3-4 weeks.     Current Anticoagulation  Instructions: INR 2.2  Continue on same dosage 1 tablet daily except 1.5 tablets on Wednesdays.  Recheck in 4 weeks.

## 2010-12-27 NOTE — Cardiovascular Report (Signed)
Summary: Office Visit   Office Visit   Imported By: Roderic Ovens 12/04/2010 16:25:54  _____________________________________________________________________  External Attachment:    Type:   Image     Comment:   External Document

## 2010-12-27 NOTE — Assessment & Plan Note (Signed)
Summary: f6w   Visit Type:  6 wk f/u Primary Provider:  Loreen Freud DO  CC:  no cardiac complaints today.  History of Present Illness: Stable at present.  Doing much better.  BP is much better.  Energy is not much better.  Has had alot of nasal congestion.  It is a marked improvement.  Had bronchitis.    Current Medications (verified): 1)  Coumadin 2 Mg Tabs (Warfarin Sodium) .... Take As Directed By Coumadin Clinic. 2)  Sertraline Hcl 100 Mg Tabs (Sertraline Hcl) .[name].. 1 By Mouth Qd 3)  Diovan 320 Mg Tabs (Valsartan) .... Take 1/2 Tablet Daily 4)  Carbidopa-Levodopa 25-100 Mg  Tabs (Carbidopa-Levodopa) .... Take One and One Half Tablet Every Morning, One and One Half At [facility], One Half Tablet Every Night At Bedtime 5)  Synthroid 88 Mcg  Tabs (Levothyroxine Sodium) .[name].. 1 By Mouth Once Daily 6)  Norvasc 5 Mg Tabs (Amlodipine Besylate) .... Take One Daily 7)  Adult Aspirin Ec Low Strength 81 Mg Tbec (Aspirin) .... Take One Tablet Once Daily 8)  Folic Acid 400 Mcg Tabs (Folic Acid) .... Take 1 By Mouth Once Daily 9)  Nitrostat 0.4 Mg Subl (Nitroglycerin) .[name].. 1 Tablet Under Tongue At Onset of Chest Pain; You May Repeat Every 5 Minutes For Up To 3 Doses. 10)  Potassium Gluconate 595 Mg Cr-Tabs (Potassium Gluconate) .... Take 1 By Mouth Once Daily 11)  Metoprolol Tartrate 25 Mg Tabs (Metoprolol Tartrate) .... Take One Tablet By Mouth Twice A Day 12)  Zithromax Z-Pak 250 Mg Tabs (Azithromycin) .... As Directed  Allergies: 1)  ! Pcn 2)  ! Trandate (Labetalol Hcl) 3)  ! Hytrin 4)  ! Lotrel (Amlodipine Besy-Benazepril Hcl) 5)  ! Codeine 6)  ! Levaquin (Levofloxacin) 7)  ! Keflex 8)  Amoxicillin (Amoxicillin) 9)  Codeine Phosphate (Codeine Phosphate) 10)  * Hytrin (Terazosin) 11)  Niaspan (Niacin (Antihyperlipidemic))  Vital Signs:  Patient profile:   77 year old female Height:      61 inches Weight:      137 pounds BMI:     25.98 Pulse rate:   72 / minute Pulse rhythm:    regular BP sitting:   150 / 80  (left arm) Cuff size:   regular  Vitals Entered By: Danielle Rankin, CMA (May 03, 2010 11:30 AM)  Physical Exam  General:  Well developed, well nourished, in no acute distress. Head:  normocephalic and atraumatic Eyes:  PERRLA/EOM intact; conjunctiva and lids normal. Lungs:  Clear bilaterally to auscultation and percussion. Heart:  PMI non displaced.  Paradoxic S2.  Minimal SEM.  No DM.  Extremities:  No clubbing or cyanosis.  No swelling.   PPM Specifications Following MD:  Sherryl Manges, MD     PPM Vendor:  [employer]     PPM Model Number:  [id number]     PPM Serial Number:  [id number] PPM DOI:  01/01/2005      Lead 1    Location: [facility]     DOI: 05/01/1998     Model #: [id number]     Serial #: [id number]     Status: active Lead 2    Location: [facility]     DOI: 05/01/1998     Model #: [id number]     Serial #: [id number]     Status: active   Indications:  SINCE NODE ARREST      PPM Follow Up Pacer Dependent:  Yes      Episodes Coumadin:  Yes  Parameters Mode:  DDDR     Lower Rate Limit:  60     Upper Rate Limit:  120 Paced AV Delay:  300     Sensed AV Delay:  250  Impression & Recommendations:  Problem # 1:  CORONARY ARTERY DISEASE (ICD-414.00) Stable at present time on a medical regimen. Her updated medication list for this problem includes:    Coumadin 2 Mg Tabs (Warfarin sodium) .[name]... Take as directed by coumadin clinic.    Norvasc 5 Mg Tabs (Amlodipine besylate) .[name]... Take one daily    Adult Aspirin Ec Low Strength 81 Mg Tbec (Aspirin) .[name]... Take one tablet once daily    Nitrostat 0.4 Mg Subl (Nitroglycerin) .[name]... 1 tablet under tongue at onset of chest pain; you may repeat every 5 minutes for up to 3 doses.    Metoprolol Tartrate 25 Mg Tabs (Metoprolol tartrate) .[name]... Take one tablet by mouth twice a day  Problem # 2:  HYPERTENSION (ICD-401.9) Moderate control, but stable at present.   Her updated medication list for this problem includes:    Diovan 320 Mg Tabs  (Valsartan) .[name]... Take 1/2 tablet daily    Norvasc 5 Mg Tabs (Amlodipine besylate) .[name]... Take one daily    Adult Aspirin Ec Low Strength 81 Mg Tbec (Aspirin) .[name]... Take one tablet once daily    Metoprolol Tartrate 25 Mg Tabs (Metoprolol tartrate) .[name]... Take one tablet by mouth twice a day  Orders: TLB-BMP (Basic Metabolic Panel-BMET) (80048-METABOL)  Problem # 3:  ATRIAL FIBRILLATION (ICD-427.31) Staying in NSR.  Her updated medication list for this problem includes:    Coumadin 2 Mg Tabs (Warfarin sodium) .[name]... Take as directed by coumadin clinic.    Adult Aspirin Ec Low Strength 81 Mg Tbec (Aspirin) .[name]... Take one tablet once daily    Metoprolol Tartrate 25 Mg Tabs (Metoprolol tartrate) .[name]... Take one tablet by mouth twice a day  Problem # 4:  HYPERCHOLESTEROLEMIA  IIA (ICD-272.0) Currently not on treatment.  Unable to tolerate statins--felt like in a trance.    Patient Instructions: 1)  Your physician recommends that you schedule a follow-up appointment in: 3 months 2)  Your physician recommends that you return for lab work WU:[id number] BMET

## 2010-12-27 NOTE — Progress Notes (Signed)
Summary: Results [facility] 1/4)  Phone Note Outgoing Call   Summary of Call: Regarding lab results, LMTCB:  no sinusitis-- -ov to f/u if symptoms no better Initial call taken by: Army Fossa CMA,  November 28, 2009 4:39 PM  Follow-up for Phone Call        pt aware- she has an appt on friday.  Follow-up by: Army Fossa CMA,  November 28, 2009 4:46 PM

## 2010-12-27 NOTE — Assessment & Plan Note (Signed)
Summary: nausea,dizziness,chills/kdc   Vital Signs:  Patient profile:   77 year old female Weight:      138 pounds Temp:     98.1 degrees F oral Pulse rate:   82 / minute Pulse rhythm:   regular BP sitting:   122 / 76  (left arm) Cuff size:   regular  Vitals Entered By: Army Fossa CMA (November 30, 2009 1:03 PM) CC: Pt c/o nausea, some dizziness, having some sinus pressure. face feels painful., URI symptoms   History of Present Illness:       This is an 77 year old woman who presents with URI symptoms.  The patient complains of nasal congestion, purulent nasal discharge, productive cough, and earache, but denies clear nasal discharge, sore throat, dry cough, and sick contacts.  The patient denies fever, low-grade fever (<100.5 degrees), fever of 100.5-103 degrees, fever of 103.1-104 degrees, fever to >104 degrees, stiff neck, dyspnea, wheezing, rash, vomiting, diarrhea, use of an antipyretic, and response to antipyretic.  The patient also reports headache.  The patient denies itchy watery eyes, itchy throat, sneezing, seasonal symptoms, response to antihistamine, muscle aches, and severe fatigue.  The patient denies the following risk factors for Strep sinusitis: unilateral facial pain, unilateral nasal discharge, poor response to decongestant, double sickening, tooth pain, Strep exposure, tender adenopathy, and absence of cough. + b/l sinus pressure.   Cardiology ordered sinus films--- negative.   She was just in to see Dr Riley Kill-- everything was good-per pt.   He asked that she f/u here for her sinuses.       Current Medications (verified): 1)  Coumadin 2 Mg Tabs (Warfarin Sodium) .... Take As Directed By Coumadin Clinic. 2)  Sertraline Hcl 100 Mg Tabs (Sertraline Hcl) .[name].. 1 By Mouth Qd 3)  Diovan 160 Mg Tabs (Valsartan) .[name].. 1 By Mouth Once Daily 4)  Carbidopa-Levodopa 25-100 Mg  Tabs (Carbidopa-Levodopa) .... Take One and One Half Tablet Every Morning, One and One Half At [facility], One  Half Tablet Every Night At Bedtime 5)  Synthroid 88 Mcg  Tabs (Levothyroxine Sodium) .[name].. 1 By Mouth Once Daily**labs Due Now** 6)  Norvasc 5 Mg Tabs (Amlodipine Besylate) .... Take One Daily 7)  Adult Aspirin Ec Low Strength 81 Mg Tbec (Aspirin) .... Take One Tablet Once Daily 8)  Folic Acid 400 Mcg Tabs (Folic Acid) .... Take 1 By Mouth Once Daily 9)  Nitrostat 0.4 Mg Subl (Nitroglycerin) .[name].. 1 Tablet Under Tongue At Onset of Chest Pain; You May Repeat Every 5 Minutes For Up To 3 Doses. 10)  Famotidine 20 Mg Tabs (Famotidine) .... Take 1 By Mouth As Needed 11)  Potassium Gluconate 595 Mg Cr-Tabs (Potassium Gluconate) .... Take 1 By Mouth Once Daily 12)  Veramyst 27.5 Mcg/spray Susp (Fluticasone Furoate) .... 2 Sprays Each Nostril Once Daily 13)  Avelox 400 Mg Tabs (Moxifloxacin Hcl) .[name].. 1 By Mouth Once Daily 14)  Antivert 25 Mg Tabs (Meclizine Hcl) .[name].. 1 By Mouth Qid As Needed Dizziness  Allergies: 1)  ! Pcn 2)  ! Trandate (Labetalol Hcl) 3)  ! Hytrin 4)  ! Lotrel (Amlodipine Besy-Benazepril Hcl) 5)  ! Codeine 6)  ! Levaquin (Levofloxacin) 7)  ! Keflex 8)  Amoxicillin (Amoxicillin) 9)  Codeine Phosphate (Codeine Phosphate) 10)  * Hytrin (Terazosin) 11)  Niaspan (Niacin (Antihyperlipidemic))  Past History:  Past medical, surgical, family and social histories (including risk factors) reviewed for relevance to current acute and chronic problems.  Past Medical History: Reviewed history  from 10/24/2009 and no changes required. PARKINSON'S DISEASE (ICD-332.0) CORONARY ARTERY DISEASE (ICD-414.00)      a. s/p CABG x4 LIMA-LAD; SVG-PDA-PL; SVG-OM.l      b. cath 09/2009 - patent grafts, native multivessel dzs. - Medical Rx. 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 CATARACT EXTRACTION, HX OF (ICD-V45.61) POLYPECTOMY, HX OF (ICD-V15.9) CORONARY ARTERY BYPASS GRAFT, HX OF (ICD-V45.81) * RIGHT L4-5 FAR-LATERAL METRIX MICRODISCECTOMY PACEMAKER, PERMANENT (ICD-V45.01)  Family History: Reviewed history from 08/29/2008 and no changes required. sister d ESCOPD Father: neg Mother: neg Siblings: sister HTN, COPD; sister PMR,GCA(arterititis);bro d 69, ETOH  Social History: Reviewed history from 08/29/2008 and no changes required. Retired Never Smoked Alcohol use-no  Review of Systems      See HPI  Physical Exam  General:  Well-developed,well-nourished,in no acute distress; alert,appropriate and cooperative throughout examination Ears:  External ear exam shows no significant lesions or deformities.  Otoscopic examination reveals clear canals, tympanic membranes are intact bilaterally without bulging, retraction, inflammation or discharge. Hearing is grossly normal bilaterally. Nose:  L frontal sinus tenderness, L maxillary sinus tenderness, R frontal sinus tenderness, and R maxillary sinus tenderness.   Mouth:  Oral mucosa and oropharynx without lesions or exudates.  Teeth in good repair. Neck:  No deformities, masses, or tenderness noted. Lungs:  Normal respiratory effort, chest expands symmetrically. Lungs are clear to auscultation, no crackles or wheezes. Heart:  normal rate.   Psych:  Oriented X3 and normally interactive.     Impression & Recommendations:  Problem # 1:  SINUSITIS - ACUTE-NOS (ICD-461.9)  Her updated medication list for this problem includes:    Veramyst 27.5 Mcg/spray Susp (Fluticasone furoate) .[name]... 2 sprays each nostril once daily    Avelox 400 Mg Tabs  (Moxifloxacin hcl) .[name]... 1 by mouth once daily  Instructed on treatment. Call if symptoms persist or worsen.   Orders: Admin of Therapeutic Inj  intramuscular or subcutaneous ([id number]) Depo- Medrol 80mg  (J1040)  Complete Medication List: 1)  Coumadin 2 Mg Tabs (Warfarin sodium) .... Take as directed by coumadin clinic. 2)  Sertraline Hcl 100 Mg Tabs (Sertraline hcl) .[name].. 1 by mouth qd 3)  Diovan 160 Mg Tabs (Valsartan) .[name].. 1 by mouth once daily 4)  Carbidopa-levodopa 25-100 Mg Tabs (Carbidopa-levodopa) .... Take one and one half tablet every morning, one and one half at noon, one half tablet every night at bedtime 5)  Synthroid 88 Mcg Tabs (Levothyroxine sodium) .[name].. 1 by mouth once daily**labs due now** 6)  Norvasc 5 Mg Tabs (Amlodipine besylate) .... Take one daily 7)  Adult Aspirin Ec Low Strength 81 Mg Tbec (Aspirin) .... Take one tablet once daily 8)  Folic Acid 400 Mcg Tabs (Folic acid) .... Take 1 by mouth once daily 9)  Nitrostat 0.4 Mg Subl (Nitroglycerin) .[name].. 1 tablet under tongue at onset of chest pain; you may repeat every 5 minutes for up to 3 doses. 10)  Famotidine 20 Mg Tabs (Famotidine) .... Take 1 by mouth as needed 11)  Potassium Gluconate 595 Mg Cr-tabs (Potassium gluconate) .... Take 1 by mouth once daily 12)  Veramyst 27.5 Mcg/spray Susp (Fluticasone furoate) .... 2 sprays each nostril once daily 13)  Avelox 400  Mg Tabs (Moxifloxacin hcl) .[name].. 1 by mouth once daily 14)  Antivert 25 Mg Tabs (Meclizine hcl) .[name].. 1 by mouth qid as needed dizziness  Other Orders: T-2 View CXR ([id number]) Prescriptions: VERAMYST 27.5 MCG/SPRAY SUSP (FLUTICASONE FUROATE) 2 sprays each nostril once daily  #1 x 0   Entered and Authorized by:   Loreen Freud DO   Signed by:   Loreen Freud DO on 11/30/2009   Method used:   Historical   RxID:   [id number] ANTIVERT 25 MG TABS (MECLIZINE HCL) 1 by mouth qid as needed dizziness  #30 x 0   Entered and Authorized by:   Loreen Freud DO    Signed by:   Loreen Freud DO on 11/30/2009   Method used:   Electronically to        [id number] (retail)       [address]       [place], [place]  [address]       Ph: [phone] or [phone]       Fax: [phone]   RxID:   [id number] AVELOX 400 MG TABS (MOXIFLOXACIN HCL) 1 by mouth once daily  #10 x 0   Entered and Authorized by:   Loreen Freud DO   Signed by:   Loreen Freud DO on 11/30/2009   Method used:   Electronically to        [id number]* (retail)       [address]       [place], [place]  [address]       Ph: [phone] or [phone]       Fax: [phone]   RxID:   [id number]    Medication Administration  Injection # 1:    Medication: Depo- Medrol 80mg     Diagnosis: SINUSITIS - ACUTE-NOS (ICD-461.9)    Route: IM    Site: LUOQ gluteus    Exp Date: 08/2010    Lot #: [id number]    Mfr: [email or web address]    Patient tolerated injection without complications    Given by: Army Fossa CMA (November 30, 2009 2:06 PM)  Orders Added: 1)  T-2 View CXR [[id number]] 2)  Est. Patient Level III [62831] 3)  Admin of Therapeutic Inj  intramuscular or subcutaneous [[id number]] 4)  Depo- Medrol 80mg  [J1040]

## 2010-12-27 NOTE — Miscellaneous (Signed)
Summary: Orders Update  Clinical Lists Changes  Orders: Added new Test order of Carotid Duplex (Carotid Duplex) - Signed 

## 2010-12-27 NOTE — Medication Information (Signed)
Summary: rov/tm  Anticoagulant Therapy  Managed by: Weston Brass, PharmD Referring MD: Shawnie Pons MD PCP: Loreen Freud DO Supervising MD: Riley Kill MD, Maisie Fus Indication 1: Atrial Fibrillation (ICD-427.31) Lab Used: [facility] [place] Site: [address] INR POC 3.4 INR RANGE 2 - 3  Dietary changes: no    Health status changes: no    Bleeding/hemorrhagic complications: no    Recent/future hospitalizations: no    Any changes in medication regimen? yes       Details: took zpack and now on doxycycline for bronchitis.   Recent/future dental: no  Any missed doses?: no       Is patient compliant with meds? yes       Allergies: 1)  ! Pcn 2)  ! Trandate (Labetalol Hcl) 3)  ! Hytrin 4)  ! Lotrel (Amlodipine Besy-Benazepril Hcl) 5)  ! Codeine 6)  ! Levaquin (Levofloxacin) 7)  ! Keflex 8)  Amoxicillin (Amoxicillin) 9)  Codeine Phosphate (Codeine Phosphate) 10)  * Hytrin (Terazosin) 11)  Niaspan (Niacin (Antihyperlipidemic))  Anticoagulation Management History:      The patient is taking warfarin and comes in today for a routine follow up visit.  Positive risk factors for bleeding include an age of 75 years or older.  The bleeding index is 'intermediate risk'.  Positive CHADS2 values include History of HTN and Age > 39 years old.  The start date was 01/10/2003.  Anticoagulation responsible provider: Riley Kill MD, Maisie Fus.  INR POC: 3.4.  Cuvette Lot#: [id number].  Exp: 07/2011.    Anticoagulation Management Assessment/Plan:      The patient's current anticoagulation dose is Coumadin 2 mg tabs: Take as directed by coumadin clinic..  The target INR is 2 - 3.  The next INR is due 06/06/2010.  Anticoagulation instructions were given to patient.  Results were reviewed/authorized by Weston Brass, PharmD.  She was notified by Weston Brass PharmD.         Prior Anticoagulation Instructions: INR-2.5 Resume normal dosing schedule. Take 1 tablet daily except for Wednesday take 1.5  tablets. Return in 4 weeks.  Current Anticoagulation Instructions: INR 3.4  Skip today's dose of Coumadin then resume same dose of 1 tablet every day except 1 1/2 tablets on Wednesday.

## 2010-12-27 NOTE — Progress Notes (Signed)
Summary: nausea,chills, nausea,fever  Phone Note Call from Patient Call back at [facility] Phone [phone]   Caller: Patient Summary of Call: pt daughter left VM states that pt c/o fever, chills, nausea, and dizziness x1day..............................Marland KitchenFelecia Deloach CMA  November 30, 2009 10:02 AM   pt states that symptoms have increase and does not feel that she can't wait until OV tomorrow. pt would like to be seen today. pt c/o nausea, chills,dizziness, facial pain. pt denies any cough, fever, or SOB...............................Marland KitchenFelecia Deloach CMA  November 30, 2009 10:10 AM  pt coming in for OV today................Marland KitchenFelecia Deloach CMA  November 30, 2009 10:24 AM

## 2010-12-27 NOTE — Medication Information (Signed)
Summary: rov/ewj  Anticoagulant Therapy  Managed by: Bethena Midget, RN, BSN Referring MD: Shawnie Pons MD PCP: Loreen Freud DO Supervising MD: Gala Romney MD, Reuel Boom Indication 1: Atrial Fibrillation (ICD-427.31) Lab Used: [facility] [place] Site: [address] INR POC 3.0 INR RANGE 2 - 3  Dietary changes: yes       Details: Maybe sl. less green leafy veggies  Health status changes: no    Bleeding/hemorrhagic complications: no    Recent/future hospitalizations: no    Any changes in medication regimen? no    Recent/future dental: no  Any missed doses?: no       Is patient compliant with meds? yes      Comments: Seeing Dr. Tedra Senegal today.  Allergies: 1)  ! Pcn 2)  ! Trandate (Labetalol Hcl) 3)  ! Hytrin 4)  ! Lotrel (Amlodipine Besy-Benazepril Hcl) 5)  ! Codeine 6)  ! Levaquin (Levofloxacin) 7)  ! Keflex 8)  Amoxicillin (Amoxicillin) 9)  Codeine Phosphate (Codeine Phosphate) 10)  * Hytrin (Terazosin) 11)  Niaspan (Niacin (Antihyperlipidemic))  Anticoagulation Management History:      The patient is taking warfarin and comes in today for a routine follow up visit.  Positive risk factors for bleeding include an age of 75 years or older.  The bleeding index is 'intermediate risk'.  Positive CHADS2 values include History of HTN and Age > 75 years old.  The start date was 01/10/2003.  Anticoagulation responsible provider: Leontina Skidmore MD, Reuel Boom.  INR POC: 3.0.  Cuvette Lot#: [id number].  Exp: 04/2011.    Anticoagulation Management Assessment/Plan:      The patient's current anticoagulation dose is Coumadin 2 mg tabs: Take as directed by coumadin clinic..  The target INR is 2 - 3.  The next INR is due 04/18/2010.  Anticoagulation instructions were given to patient.  Results were reviewed/authorized by Bethena Midget, RN, BSN.  She was notified by Bethena Midget, RN, BSN.         Prior Anticoagulation Instructions: INR 2.1  Continue on same dosage 1 tablet daily except  1.5 tablets on Wednesdays.  Recheck in 4 weeks.    Current Anticoagulation Instructions: INR 3.0 Today take 2mg s then resume 2mg s daily except 3mg s on Wednesdays. Recheck in 4 weeks.

## 2010-12-27 NOTE — Medication Information (Signed)
Summary: rov/sp  Anticoagulant Therapy  Managed by: Weston Brass, PharmD Referring MD: Shawnie Pons MD PCP: Loreen Freud DO Supervising MD: Gala Romney MD, Reuel Boom Indication 1: Atrial Fibrillation (ICD-427.31) Lab Used: [facility]  Site: [address] INR POC 2.5 INR RANGE 2 - 3  Dietary changes: no    Health status changes: no    Bleeding/hemorrhagic complications: no    Recent/future hospitalizations: no    Any changes in medication regimen? no    Recent/future dental: no  Any missed doses?: no       Is patient compliant with meds? yes       Allergies: 1)  ! Pcn 2)  ! Trandate (Labetalol Hcl) 3)  ! Hytrin 4)  ! Lotrel (Amlodipine Besy-Benazepril Hcl) 5)  ! Codeine 6)  ! Levaquin (Levofloxacin) 7)  ! Keflex 8)  Amoxicillin (Amoxicillin) 9)  Codeine Phosphate (Codeine Phosphate) 10)  * Hytrin (Terazosin) 11)  Niaspan (Niacin (Antihyperlipidemic))  Anticoagulation Management History:      The patient is taking warfarin and comes in today for a routine follow up visit.  Positive risk factors for bleeding include an age of 75 years or older.  The bleeding index is 'intermediate risk'.  Positive CHADS2 values include History of HTN and Age > 26 years old.  The start date was 01/10/2003.  Anticoagulation responsible provider: Bensimhon MD, Reuel Boom.  INR POC: 2.5.  Cuvette Lot#: [id number].  Exp: 06/2011.    Anticoagulation Management Assessment/Plan:      The patient's current anticoagulation dose is Coumadin 2 mg tabs: Take as directed by coumadin clinic..  The target INR is 2 - 3.  The next INR is due 05/16/2010.  Anticoagulation instructions were given to patient.  Results were reviewed/authorized by Weston Brass, PharmD.  She was notified by Alcus Dad B Pharm.         Prior Anticoagulation Instructions: INR 3.0 Today take 2mg s then resume 2mg s daily except 3mg s on Wednesdays. Recheck in 4 weeks.   Current Anticoagulation Instructions: INR-2.5 Resume  normal dosing schedule. Take 1 tablet daily except for Wednesday take 1.5 tablets. Return in 4 weeks.

## 2010-12-27 NOTE — Medication Information (Signed)
Summary: rov/jaj  Anticoagulant Therapy  Managed by: Weston Brass, PharmD Referring MD: Shawnie Pons MD PCP: Loreen Freud DO Supervising MD: Graciela Husbands MD, Viviann Spare Indication 1: Atrial Fibrillation (ICD-427.31) Lab Used: [facility] [place] Site: [address] INR POC 2.2 INR RANGE 2 - 3  Dietary changes: no    Health status changes: no    Bleeding/hemorrhagic complications: no    Recent/future hospitalizations: no    Any changes in medication regimen? no    Recent/future dental: no  Any missed doses?: no       Is patient compliant with meds? yes       Allergies: 1)  ! Pcn 2)  ! Trandate (Labetalol Hcl) 3)  ! Hytrin 4)  ! Lotrel (Amlodipine Besy-Benazepril Hcl) 5)  ! Codeine 6)  ! Levaquin (Levofloxacin) 7)  ! Keflex 8)  Amoxicillin (Amoxicillin) 9)  Codeine Phosphate (Codeine Phosphate) 10)  * Hytrin (Terazosin) 11)  Niaspan (Niacin (Antihyperlipidemic))  Anticoagulation Management History:      The patient is taking warfarin and comes in today for a routine follow up visit.  Positive risk factors for bleeding include an age of 17 years or older.  The bleeding index is 'intermediate risk'.  Positive CHADS2 values include History of HTN and Age > 75 years old.  The start date was 01/10/2003.  Anticoagulation responsible provider: Graciela Husbands MD, Viviann Spare.  INR POC: 2.2.  Cuvette Lot#: [id number].  Exp: 09/2011.    Anticoagulation Management Assessment/Plan:      The patient's current anticoagulation dose is Coumadin 2 mg tabs: Take as directed by coumadin clinic..  The target INR is 2 - 3.  The next INR is due 10/10/2010.  Anticoagulation instructions were given to patient.  Results were reviewed/authorized by Weston Brass, PharmD.  She was notified by Ilean Skill D candidate.         Prior Anticoagulation Instructions: INR 2.6  Continue taking 1 tablet everyday except take 1 1/2 tablets on Wednesdays. Re-check INR in 4 weeks.   Current Anticoagulation  Instructions: INR 2.2  Continue taking 1 tablet everyday except 1 1/2 tablets on Wednesday. Recheck in 4 weeks.

## 2010-12-27 NOTE — Procedures (Signed)
Summary: 6 mth pacer ck   Current Medications (verified): 1)  Coumadin 2 Mg Tabs (Warfarin Sodium) .... Take As Directed By Coumadin Clinic. 2)  Sertraline Hcl 100 Mg Tabs (Sertraline Hcl) .[name].. 1 By Mouth Qd 3)  Diovan 320 Mg Tabs (Valsartan) .... Take 1/2 Tablet Daily 4)  Carbidopa-Levodopa 25-100 Mg  Tabs (Carbidopa-Levodopa) .... Take One and One Half Tablet Every Morning, One and One Half At [facility], One Half Tablet Every Night At Bedtime 5)  Synthroid 88 Mcg  Tabs (Levothyroxine Sodium) .[name].. 1 By Mouth Once Daily 6)  Norvasc 5 Mg Tabs (Amlodipine Besylate) .... Take One Daily 7)  Adult Aspirin Ec Low Strength 81 Mg Tbec (Aspirin) .... Take One Tablet Once Daily 8)  Folic Acid 400 Mcg Tabs (Folic Acid) .... Take 1 By Mouth Once Daily 9)  Nitrostat 0.4 Mg Subl (Nitroglycerin) .[name].. 1 Tablet Under Tongue At Onset of Chest Pain; You May Repeat Every 5 Minutes For Up To 3 Doses. 10)  Potassium Gluconate 595 Mg Cr-Tabs (Potassium Gluconate) .... Take 1 By Mouth Once Daily 11)  Propranolol Hcl Cr 80 Mg Xr24h-Cap (Propranolol Hcl) .... Take 1 Tablet By Mouth Once A Day  Allergies (verified): 1)  ! Pcn 2)  ! Trandate (Labetalol Hcl) 3)  ! Hytrin 4)  ! Lotrel (Amlodipine Besy-Benazepril Hcl) 5)  ! Codeine 6)  ! Levaquin (Levofloxacin) 7)  ! Keflex 8)  Amoxicillin (Amoxicillin) 9)  Codeine Phosphate (Codeine Phosphate) 10)  * Hytrin (Terazosin) 11)  Niaspan (Niacin (Antihyperlipidemic))  PPM Specifications Following MD:  Sherryl Manges, MD     PPM Vendor:  GUIDANT     PPM Model Number:  [id number]     PPM Serial Number:  [id number] PPM DOI:  01/01/2005      Lead 1    Location: [facility]     DOI: 05/01/1998     Model #: 6940     Serial #: [id number]     Status: active Lead 2    Location: RV     DOI: 05/01/1998     Model #: 1346     Serial #: [id number]     Status: active   Indications:  SINCE NODE ARREST      PPM Follow Up Remote Check?  No Battery Voltage:  good V     Battery Est. Longevity:  3 years       Pacer Dependent:  Yes       PPM Device Measurements Atrium  Impedance: 680 ohms, Threshold: 0.7 V at 0.4 msec Right Ventricle  Impedance: 700 ohms, Threshold: 0.4 V at 0.4 msec  Episodes MS Episodes:  4     Percent Mode Switch:  o%     Coumadin:  Yes Atrial Pacing:  100%     Ventricular Pacing:  100%  Parameters Mode:  DDDR     Lower Rate Limit:  60     Upper Rate Limit:  120 Paced AV Delay:  300     Sensed AV Delay:  250 Next Cardiology Appt Due:  04/26/2011 Tech Comments:  No parameter changes.  Device function normal.  ROV 6 months with Dr. Graciela Husbands. Altha Harm, LPN  October 29, 2010 9:55 AM

## 2010-12-27 NOTE — Progress Notes (Signed)
  Phone Note Call from Patient   Summary of Call: Pt called and stated she has SOB and shaking when she took the Avelox. She has not taken today. Per Dr.Lowne try Doxyclyine 100 mg 1 by mouth two times a day x 10 days.   Pt is aware.  Initial call taken by: Army Fossa CMA,  December 01, 2009 4:42 PM    New/Updated Medications: DOXYCYCLINE HYCLATE 100 MG TABS (DOXYCYCLINE HYCLATE) 1 by mouth two times a day x 10 days. Prescriptions: DOXYCYCLINE HYCLATE 100 MG TABS (DOXYCYCLINE HYCLATE) 1 by mouth two times a day x 10 days.  #20 x 0   Entered by:   Army Fossa CMA   Authorized by:   Loreen Freud DO   Signed by:   Army Fossa CMA on 12/01/2009   Method used:   Electronically to        [id number] (retail)       [address]       [place], [place]  [address]       Ph: [phone] or [phone]       Fax: [phone]   RxID:   [id number]

## 2010-12-27 NOTE — Assessment & Plan Note (Signed)
Summary: f2w   Visit Type:  Follow-up Primary Provider:  Loreen Freud DO  CC:  fatigue.  History of Present Illness: BP are better controlled at this point.  No chest pain.  Her energy is down.    Current Medications (verified): 1)  Coumadin 2 Mg Tabs (Warfarin Sodium) .... Take As Directed By Coumadin Clinic. 2)  Sertraline Hcl 100 Mg Tabs (Sertraline Hcl) .[name].. 1 By Mouth Qd 3)  Diovan 320 Mg Tabs (Valsartan) .... Take 1/2 Tablet Daily 4)  Carbidopa-Levodopa 25-100 Mg  Tabs (Carbidopa-Levodopa) .... Take One and One Half Tablet Every Morning, One and One Half At [facility], One Half Tablet Every Night At Bedtime 5)  Synthroid 88 Mcg  Tabs (Levothyroxine Sodium) .[name].. 1 By Mouth Once Daily 6)  Norvasc 5 Mg Tabs (Amlodipine Besylate) .... Take One Daily 7)  Adult Aspirin Ec Low Strength 81 Mg Tbec (Aspirin) .... Take One Tablet Once Daily 8)  Folic Acid 400 Mcg Tabs (Folic Acid) .... Take 1 By Mouth Once Daily 9)  Nitrostat 0.4 Mg Subl (Nitroglycerin) .[name].. 1 Tablet Under Tongue At Onset of Chest Pain; You May Repeat Every 5 Minutes For Up To 3 Doses. 10)  Potassium Gluconate 595 Mg Cr-Tabs (Potassium Gluconate) .... Take 1 By Mouth Once Daily 11)  Metoprolol Tartrate 25 Mg Tabs (Metoprolol Tartrate) .... Take One Tablet By Mouth Twice A Day  Allergies (verified): 1)  ! Pcn 2)  ! Trandate (Labetalol Hcl) 3)  ! Hytrin 4)  ! Lotrel (Amlodipine Besy-Benazepril Hcl) 5)  ! Codeine 6)  ! Levaquin (Levofloxacin) 7)  ! Keflex 8)  Amoxicillin (Amoxicillin) 9)  Codeine Phosphate (Codeine Phosphate) 10)  * Hytrin (Terazosin) 11)  Niaspan (Niacin (Antihyperlipidemic))  Past History:  Past Medical History: Last updated: 10/24/2009 PARKINSON'S DISEASE (ICD-332.0) CORONARY ARTERY DISEASE (ICD-414.00)      a. s/p CABG x4 LIMA-LAD; SVG-PDA-PL; SVG-OM.l      b. cath 09/2009 - patent grafts, native multivessel dzs. - Medical Rx. 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 Head:  normocephalic and atraumatic Eyes:  PERRLA/EOM intact; conjunctiva and lids normal. Lungs:  Clear bilaterally to auscultation and percussion. Heart:  PMI non displaced. 1/6 SEM.  No diastolic murmur.   Pulses:  pulses normal in all 4 extremities Extremities:  No clubbing or cyanosis. Neurologic:  Alert and oriented x 3.   EKG  Procedure date:  03/21/2010  Findings:      AV Pacing.    PPM Specifications Following MD:  Sherryl Manges, MD     PPM Vendor:  [employer]     PPM Model Number:  [id number]     PPM Serial Number:  [id number] PPM DOI:  01/01/2005      Lead 1    Location: [facility]  DOI: 05/01/1998     Model #: [id number]     Serial #: [id number]     Status: active Lead 2    Location: RV     DOI: 05/01/1998     Model #: 1346     Serial #: [id number]     Status: active   Indications:  SINCE NODE ARREST      PPM Follow Up Pacer Dependent:  Yes      Episodes Coumadin:  Yes  Parameters Mode:  DDDR     Lower Rate Limit:  60     Upper Rate Limit:  120 Paced AV Delay:  300     Sensed AV Delay:  250  Impression & Recommendations:  Problem # 1:  CORONARY ARTERY DISEASE (ICD-414.00)  stable.  No chest pain.  Tolerating things well.  Her updated medication list for this  problem includes:    Coumadin 2 Mg Tabs (Warfarin sodium) .[name]... Take as directed by coumadin clinic.    Norvasc 5 Mg Tabs (Amlodipine besylate) .[name]... Take one daily    Adult Aspirin Ec Low Strength 81 Mg Tbec (Aspirin) .[name]... Take one tablet once daily    Nitrostat 0.4 Mg Subl (Nitroglycerin) .[name]... 1 tablet under tongue at onset of chest pain; you may repeat every 5 minutes for up to 3 doses.    Metoprolol Tartrate 25 Mg Tabs (Metoprolol tartrate) .[name]... 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  She is stable.   Problem # 3:  ATRIAL FIBRILLATION (ICD-427.31)  currently normal sinus rhythm.  Her updated medication list for this problem includes:    Coumadin 2 Mg Tabs (Warfarin sodium) .[name]... Take as directed by coumadin clinic.    Adult Aspirin Ec Low Strength 81 Mg Tbec (Aspirin) .[name]... Take one tablet once daily    Metoprolol Tartrate 25 Mg Tabs (Metoprolol tartrate) .[name]... Take one tablet by mouth twice a day  Orders: EKG w/ Interpretation (93000)  Problem # 4:  HYPERTENSION (ICD-401.9) Control is better.   Her updated medication list for this problem includes:    Diovan 320 Mg Tabs (Valsartan) .[name]... Take 1/2 tablet daily    Norvasc 5 Mg Tabs (Amlodipine besylate) .[name]... Take one daily    Adult Aspirin Ec Low Strength 81 Mg Tbec (Aspirin) .[name]... Take one tablet once daily    Metoprolol Tartrate 25 Mg Tabs (Metoprolol tartrate) .[name]... Take one tablet by mouth twice a day  Patient Instructions: 1)  Your physician recommends that you schedule a follow-up appointment in: 6 WEEKS 2)  Your physician recommends that you continue on your current medications as directed. Please refer to the Current Medication list given to you today.

## 2010-12-27 NOTE — Medication Information (Signed)
Summary: rov/ewj  Anticoagulant Therapy  Managed by: Shelby Dubin, PharmD, BCPS, CPP Referring MD: Shawnie Pons MD PCP: Loreen Freud DO Supervising MD: Myrtis Ser MD, Tinnie Gens Indication 1: Atrial Fibrillation (ICD-427.31) Lab Used: [facility] [place] Site: [address] INR POC 4.2 INR RANGE 2 - 3  Dietary changes: no    Health status changes: yes       Details: had sinus infection and Bronchitis  Bleeding/hemorrhagic complications: no    Recent/future hospitalizations: no    Any changes in medication regimen? yes       Details: Just finished Doxcycyline (12 days course)  Recent/future dental: no  Any missed doses?: no       Is patient compliant with meds? yes       Allergies (verified): 1)  ! Pcn 2)  ! Trandate (Labetalol Hcl) 3)  ! Hytrin 4)  ! Lotrel (Amlodipine Besy-Benazepril Hcl) 5)  ! Codeine 6)  ! Levaquin (Levofloxacin) 7)  ! Keflex 8)  Amoxicillin (Amoxicillin) 9)  Codeine Phosphate (Codeine Phosphate) 10)  * Hytrin (Terazosin) 11)  Niaspan (Niacin (Antihyperlipidemic))  Anticoagulation Management History:      The patient is taking warfarin and comes in today for a routine follow up visit.  Positive risk factors for bleeding include an age of 24 years or older.  The bleeding index is 'intermediate risk'.  Positive CHADS2 values include History of HTN and Age > 4 years old.  The start date was 01/10/2003.  Anticoagulation responsible provider: Myrtis Ser MD, Tinnie Gens.  INR POC: 4.2.  Cuvette Lot#: [id number].  Exp: 02/2011.    Anticoagulation Management Assessment/Plan:      The patient's current anticoagulation dose is Coumadin 2 mg tabs: Take as directed by coumadin clinic..  The target INR is 2 - 3.  The next INR is due 12/27/2009.  Anticoagulation instructions were given to patient.  Results were reviewed/authorized by Shelby Dubin, PharmD, BCPS, CPP.  She was notified by Ysidro Evert, Pharm D Candidate.         Prior Anticoagulation  Instructions: INR 2.7  Continue on same dosage 1 tablet daily except 1.5 tablets on Wednesdays.  Recheck in 4 weeks.    Current Anticoagulation Instructions: INR: 4.2 Skip today's dose and take 1/2 tablet on Thursday then resume to same dosage of 2mg  tablet daily except 3mg  on Wednesdays Recheck in 2 weeks

## 2010-12-27 NOTE — Medication Information (Signed)
Summary: Letter Regarding Sertraline/[facility]  Letter Regarding Sertraline/[facility]   Imported By: Lanelle Bal 09/24/2010 13:20:27  _____________________________________________________________________  External Attachment:    Type:   Image     Comment:   External Document

## 2010-12-27 NOTE — Cardiovascular Report (Signed)
Summary: TTM  TTM   Imported By: Roderic Ovens 02/14/2010 16:18:06  _____________________________________________________________________  External Attachment:    Type:   Image     Comment:   External Document

## 2010-12-27 NOTE — Cardiovascular Report (Signed)
Summary: Office Visit   Office Visit   Imported By: Roderic Ovens 03/14/2010 16:37:12  _____________________________________________________________________  External Attachment:    Type:   Image     Comment:   External Document

## 2010-12-27 NOTE — Medication Information (Signed)
Summary: rov/sp  Anticoagulant Therapy  Managed by: Weston Brass, PharmD Referring MD: Shawnie Pons MD PCP: Loreen Freud DO Supervising MD: Johney Frame MD, Fayrene Fearing Indication 1: Atrial Fibrillation (ICD-427.31) Lab Used: [facility] [place] Site: [address] INR POC 1.4 INR RANGE 2 - 3  Dietary changes: no    Health status changes: no    Bleeding/hemorrhagic complications: no    Recent/future hospitalizations: no    Any changes in medication regimen? yes       Details: just stopped doxycyline 12 day course for bronchitis  Recent/future dental: no  Any missed doses?: no       Is patient compliant with meds? yes       Allergies: 1)  ! Pcn 2)  ! Trandate (Labetalol Hcl) 3)  ! Hytrin 4)  ! Lotrel (Amlodipine Besy-Benazepril Hcl) 5)  ! Codeine 6)  ! Levaquin (Levofloxacin) 7)  ! Keflex 8)  Amoxicillin (Amoxicillin) 9)  Codeine Phosphate (Codeine Phosphate) 10)  * Hytrin (Terazosin) 11)  Niaspan (Niacin (Antihyperlipidemic))  Anticoagulation Management History:      The patient is taking warfarin and comes in today for a routine follow up visit.  Positive risk factors for bleeding include an age of 75 years or older.  The bleeding index is 'intermediate risk'.  Positive CHADS2 values include History of HTN and Age > 38 years old.  The start date was 01/10/2003.  Anticoagulation responsible provider: Darneshia Demary MD, Fayrene Fearing.  INR POC: 1.4.  Cuvette Lot#: [id number].  Exp: 07/2011.    Anticoagulation Management Assessment/Plan:      The patient's current anticoagulation dose is Coumadin 2 mg tabs: Take as directed by coumadin clinic..  The target INR is 2 - 3.  The next INR is due 06/20/2010.  Anticoagulation instructions were given to patient.  Results were reviewed/authorized by Weston Brass, PharmD.  She was notified by Dillard Cannon.         Prior Anticoagulation Instructions: INR 3.4  Skip today's dose of Coumadin then resume same dose of 1 tablet every day except 1 1/2  tablets on Wednesday.   Current Anticoagulation Instructions: INR 1.4  Take 2 tabs today and tomorrow.  Then resume same regimen of 1.5 tabs on Wednesday and 1 tab all other days.  Re-check INR in

## 2010-12-27 NOTE — Medication Information (Signed)
Summary: rov/ez  Anticoagulant Therapy  Managed by: Cloyde Reams, RN, BSN Referring MD: Shawnie Pons MD PCP: Loreen Freud DO Supervising MD: Gala Romney MD, Reuel Boom Indication 1: Atrial Fibrillation (ICD-427.31) Lab Used: [facility] [place] Site: [address] INR POC 2.0 INR RANGE 2 - 3  Dietary changes: no    Health status changes: no    Bleeding/hemorrhagic complications: no    Recent/future hospitalizations: no    Any changes in medication regimen? no    Recent/future dental: no  Any missed doses?: no       Is patient compliant with meds? yes       Allergies: 1)  ! Pcn 2)  ! Trandate (Labetalol Hcl) 3)  ! Hytrin 4)  ! Lotrel (Amlodipine Besy-Benazepril Hcl) 5)  ! Codeine 6)  ! Levaquin (Levofloxacin) 7)  ! Keflex 8)  Amoxicillin (Amoxicillin) 9)  Codeine Phosphate (Codeine Phosphate) 10)  * Hytrin (Terazosin) 11)  Niaspan (Niacin (Antihyperlipidemic))  Anticoagulation Management History:      The patient is taking warfarin and comes in today for a routine follow up visit.  Positive risk factors for bleeding include an age of 75 years or older.  The bleeding index is 'intermediate risk'.  Positive CHADS2 values include History of HTN and Age > 75 years old.  The start date was 01/10/2003.  Anticoagulation responsible provider: Kelyn Ponciano MD, Reuel Boom.  INR POC: 2.0.  Exp: 02/2011.    Anticoagulation Management Assessment/Plan:      The patient's current anticoagulation dose is Coumadin 2 mg tabs: Take as directed by coumadin clinic..  The target INR is 2 - 3.  The next INR is due 01/24/2010.  Anticoagulation instructions were given to patient.  Results were reviewed/authorized by Cloyde Reams, RN, BSN.  She was notified by Cloyde Reams RN.         Prior Anticoagulation Instructions: INR: 4.2 Skip today's dose and take 1/2 tablet on Thursday then resume to same dosage of 2mg  tablet daily except 3mg  on Wednesdays Recheck in 2 weeks  Current  Anticoagulation Instructions: INR 2.0  Continue on same dosage 1 tablet daily except 1.5 tablets on Wednesdays.  Recheck in 3-4 weeks.

## 2010-12-27 NOTE — Medication Information (Signed)
Summary: rov/sel  Anticoagulant Therapy  Managed by: Weston Brass, PharmD Referring MD: Shawnie Pons MD PCP: Loreen Freud DO Supervising MD: Myrtis Ser MD, Tinnie Gens Indication 1: Atrial Fibrillation (ICD-427.31) Lab Used: [facility] [place] Site: [address] INR POC 2.5 INR RANGE 2 - 3  Dietary changes: no    Health status changes: no    Bleeding/hemorrhagic complications: no    Recent/future hospitalizations: no    Any changes in medication regimen? no    Recent/future dental: no  Any missed doses?: no       Is patient compliant with meds? yes       Allergies (verified): 1)  ! Pcn 2)  ! Trandate (Labetalol Hcl) 3)  ! Hytrin 4)  ! Lotrel (Amlodipine Besy-Benazepril Hcl) 5)  ! Codeine 6)  ! Levaquin (Levofloxacin) 7)  ! Keflex 8)  Amoxicillin (Amoxicillin) 9)  Codeine Phosphate (Codeine Phosphate) 10)  * Hytrin (Terazosin) 11)  Niaspan (Niacin (Antihyperlipidemic))  Anticoagulation Management History:      The patient is taking warfarin and comes in today for a routine follow up visit.  Positive risk factors for bleeding include an age of 75 years or older.  The bleeding index is 'intermediate risk'.  Positive CHADS2 values include History of HTN and Age > 75 years old.  The start date was 01/10/2003.  Anticoagulation responsible provider: Myrtis Ser MD, Tinnie Gens.  INR POC: 2.5.  Cuvette Lot#: [id number].  Exp: 09/2011.    Anticoagulation Management Assessment/Plan:      The patient's current anticoagulation dose is Coumadin 2 mg tabs: Take as directed by coumadin clinic..  The target INR is 2 - 3.  The next INR is due 11/07/2010.  Anticoagulation instructions were given to patient.  Results were reviewed/authorized by Weston Brass, PharmD.  She was notified by Hoy Register, PharmD Candidate.         Prior Anticoagulation Instructions: INR 2.2  Continue taking 1 tablet everyday except 1 1/2 tablets on Wednesday. Recheck in 4 weeks.   Current Anticoagulation  Instructions: INR 2.5 Continue previous dose of 1 tablet everyday except 1.5 tablets on Wednesday Recheck INR in 4 weeks

## 2010-12-27 NOTE — Miscellaneous (Signed)
Summary: Immunization Entry   Immunization History:  Influenza Immunization History:    Influenza:  historical @ [facility] (09/25/2010)

## 2010-12-27 NOTE — Letter (Signed)
Summary: B/P Readings  B/P Readings   Imported By: Kassie Mends 01/16/2010 10:22:48  _____________________________________________________________________  External Attachment:    Type:   Image     Comment:   External Document

## 2010-12-27 NOTE — Assessment & Plan Note (Signed)
Summary: Still coughing, not any better/drb   Vital Signs:  Patient profile:   77 year old female Weight:      138 pounds Temp:     97.8 degrees F oral Pulse rate:   75 / minute Pulse rhythm:   regular BP sitting:   134 / 80  (left arm) Cuff size:   regular  Vitals Entered By: Army Fossa CMA (May 08, 2010 10:22 AM) CC: Pt here she states overall feels better but has some chest congestion still, URI symptoms   History of Present Illness:       This is an 77 year old woman who presents with URI symptoms.  Pt is a little better but still coughing ---see last visit.  Pt finished z pack.  The patient complains of nasal congestion, purulent nasal discharge, and productive cough, but denies earache and sick contacts.  The patient denies fever, low-grade fever (<100.5 degrees), fever of 100.5-103 degrees, fever of 103.1-104 degrees, fever to >104 degrees, stiff neck, dyspnea, wheezing, rash, vomiting, diarrhea, use of an antipyretic, and response to antipyretic.  The patient denies itchy watery eyes, itchy throat, sneezing, seasonal symptoms, response to antihistamine, headache, muscle aches, and severe fatigue.  The patient denies the following risk factors for Strep sinusitis: unilateral facial pain, unilateral nasal discharge, poor response to decongestant, double sickening, tooth pain, Strep exposure, tender adenopathy, and absence of cough.    Current Medications (verified): 1)  Coumadin 2 Mg Tabs (Warfarin Sodium) .... Take As Directed By Coumadin Clinic. 2)  Sertraline Hcl 100 Mg Tabs (Sertraline Hcl) .[name].. 1 By Mouth Qd 3)  Diovan 320 Mg Tabs (Valsartan) .... Take 1/2 Tablet Daily 4)  Carbidopa-Levodopa 25-100 Mg  Tabs (Carbidopa-Levodopa) .... Take One and One Half Tablet Every Morning, One and One Half At [facility], One Half Tablet Every Night At Bedtime 5)  Synthroid 88 Mcg  Tabs (Levothyroxine Sodium) .[name].. 1 By Mouth Once Daily 6)  Norvasc 5 Mg Tabs (Amlodipine Besylate) .... Take One  Daily 7)  Adult Aspirin Ec Low Strength 81 Mg Tbec (Aspirin) .... Take One Tablet Once Daily 8)  Folic Acid 400 Mcg Tabs (Folic Acid) .... Take 1 By Mouth Once Daily 9)  Nitrostat 0.4 Mg Subl (Nitroglycerin) .[name].. 1 Tablet Under Tongue At Onset of Chest Pain; You May Repeat Every 5 Minutes For Up To 3 Doses. 10)  Potassium Gluconate 595 Mg Cr-Tabs (Potassium Gluconate) .... Take 1 By Mouth Once Daily 11)  Metoprolol Tartrate 25 Mg Tabs (Metoprolol Tartrate) .... Take One Tablet By Mouth Twice A Day 12)  Doxycycline Hyclate 100 Mg Caps (Doxycycline Hyclate) .[name].. 1 By Mouth Two Times A Day 13)  Veramyst 27.5 Mcg/spray Susp (Fluticasone Furoate) .... 2 Sprays Each Nostril Once Daily  Allergies: 1)  ! Pcn 2)  ! Trandate (Labetalol Hcl) 3)  ! Hytrin 4)  ! Lotrel (Amlodipine Besy-Benazepril Hcl) 5)  ! Codeine 6)  ! Levaquin (Levofloxacin) 7)  ! Keflex 8)  Amoxicillin (Amoxicillin) 9)  Codeine Phosphate (Codeine Phosphate) 10)  * Hytrin (Terazosin) 11)  Niaspan (Niacin (Antihyperlipidemic))  Past History:  Past medical, surgical, family and social histories (including risk factors) reviewed for relevance to current acute and chronic problems.  Past Medical History: Reviewed history from 03/20/2010 and no changes required. Current Problems:  ATRIAL FIBRILLATION (ICD-427.31) CORONARY ARTERY DISEASE (ICD-414.00) CORONARY ARTERY BYPASS GRAFT, HX OF (ICD-V45.81) PACEMAKER, PERMANENT-GUIDANT INSIGNIA 1291 (ICD-V45.01) HYPOTENSION, ORTHOSTATIC (ICD-458.0) HYPERTENSION (ICD-401.9) HYPERLIPIDEMIA (ICD-272.4) CAROTID BRUIT, RIGHT (ICD-785.9) RENAL INSUFFICIENCY (ICD-588.9)  DEPRESSION (ICD-311) ANXIETY (ICD-300.00) AV BLOCK, COMPLETE (ICD-426.0) SINOATRIAL NODE DYSFUNCTION (ICD-427.81) DIZZINESS (ICD-780.4) HYPERCHOLESTEROLEMIA  IIA (ICD-272.0) COUGH (ICD-786.2) SINUSITIS - ACUTE-NOS (ICD-461.9) CATARACT EXTRACTION, HX OF (ICD-V45.61) POLYPECTOMY, HX OF (ICD-V15.9) PARKINSON'S DISEASE  (ICD-332.0) * RIGHT L4-5 FAR-LATERAL METRIX MICRODISCECTOMY HYPOTHYROIDISM (ICD-244.9) LOW BACK PAIN SYNDROME (ICD-724.2) FEMORAL BRUIT (ICD-785.9) SLEEP APNEA (ICD-780.57) DIVERTICULOSIS, COLON (ICD-562.10) ANEMIA-NOS (ICD-285.9)  Past Surgical History: Reviewed history from 03/20/2010 and no changes required. CORONARY ARTERY BYPASS GRAFT, HX OF (ICD-V45.81) PACEMAKER, PERMANENT (ICD-V45.01) CATARACT EXTRACTION, HX OF (ICD-V45.61) POLYPECTOMY, HX OF (ICD-V15.9 * RIGHT L4-5 FAR-LATERAL METRIX MICRODISCECTOMY  Family History: Reviewed history from 03/20/2010 and no changes required.   Both mother and father lived into their 9s.  One brother deceased who has congestive heart failure.  Three sisters, one has emphysema, one has osteoporosis, one with Alzheimer's, but all still living in their 2s.  Social History: Reviewed history from 03/20/2010 and no changes required.  The patient is a retired Engineer, civil (consulting).  She does not smoke, does  not partake of alcoholic beverages, very active in cardiac rehabilitation.  Review of Systems      See HPI  Physical Exam  General:  Well-developed,well-nourished,in no acute distress; alert,appropriate and cooperative throughout examination Ears:  External ear exam shows no significant lesions or deformities.  Otoscopic examination reveals clear canals, tympanic membranes are intact bilaterally without bulging, retraction, inflammation or discharge. Hearing is grossly normal bilaterally. Nose:  external erythema and mucosal erythema.   Mouth:  postnasal drip.   Neck:  No deformities, masses, or tenderness noted. Lungs:  occassional wheeze  R decreased breath sounds and L decreased breath sounds.   Psych:  Oriented X3 and normally interactive.     Impression & Recommendations:  Problem # 1:  BRONCHITIS- ACUTE (ICD-466.0)  Her updated medication list for this problem includes:    Doxycycline Hyclate 100 Mg Caps (Doxycycline hyclate) .[name]... 1 by mouth  two times a day  Orders: T-2 View CXR (71020TC)  Take antibiotics and other medications as directed. Encouraged to push clear liquids, get enough rest, and take acetaminophen as needed. To be seen in 5-7 days if no improvement, sooner if worse.  Complete Medication List: 1)  Coumadin 2 Mg Tabs (Warfarin sodium) .... Take as directed by coumadin clinic. 2)  Sertraline Hcl 100 Mg Tabs (Sertraline hcl) .[name].. 1 by mouth qd 3)  Diovan 320 Mg Tabs (Valsartan) .... Take 1/2 tablet daily 4)  Carbidopa-levodopa 25-100 Mg Tabs (Carbidopa-levodopa) .... Take one and one half tablet every morning, one and one half at noon, one half tablet every night at bedtime 5)  Synthroid 88 Mcg Tabs (Levothyroxine sodium) .[name].. 1 by mouth once daily 6)  Norvasc 5 Mg Tabs (Amlodipine besylate) .... Take one daily 7)  Adult Aspirin Ec Low Strength 81 Mg Tbec (Aspirin) .... Take one tablet once daily 8)  Folic Acid 400 Mcg Tabs (Folic acid) .... Take 1 by mouth once daily 9)  Nitrostat 0.4 Mg Subl (Nitroglycerin) .[name].. 1 tablet under tongue at onset of chest pain; you may repeat every 5 minutes for up to 3 doses. 10)  Potassium Gluconate 595 Mg Cr-tabs (Potassium gluconate) .... Take 1 by mouth once daily 11)  Metoprolol Tartrate 25 Mg Tabs (Metoprolol tartrate) .... Take one tablet by mouth twice a day 12)  Doxycycline Hyclate 100 Mg Caps (Doxycycline hyclate) .[name].. 1 by mouth two times a day 13)  Veramyst 27.5 Mcg/spray Susp (Fluticasone furoate) .... 2 sprays each nostril once daily Prescriptions: DOXYCYCLINE HYCLATE 100  MG CAPS (DOXYCYCLINE HYCLATE) 1 by mouth two times a day  #20 x 0   Entered and Authorized by:   Loreen Freud DO   Signed by:   Loreen Freud DO on 05/08/2010   Method used:   Electronically to        [id number] (retail)       [address]       [place], [place]  [address]       Ph: [phone] or [phone]       Fax: [phone]   RxID:    [id number]

## 2010-12-27 NOTE — Medication Information (Signed)
Summary: rov/sp  Anticoagulant Therapy  Managed by: Weston Brass, PharmD Referring MD: Shawnie Pons MD PCP: Loreen Freud DO Supervising MD: Riley Kill MD, Maisie Fus Indication 1: Atrial Fibrillation (ICD-427.31) Lab Used: [facility] [place] Site: [address] INR POC 2.9 INR RANGE 2 - 3  Dietary changes: no    Health status changes: no    Bleeding/hemorrhagic complications: no    Recent/future hospitalizations: no    Any changes in medication regimen? no    Recent/future dental: no  Any missed doses?: no       Is patient compliant with meds? yes       Allergies: 1)  ! Pcn 2)  ! Trandate (Labetalol Hcl) 3)  ! Hytrin 4)  ! Lotrel (Amlodipine Besy-Benazepril Hcl) 5)  ! Codeine 6)  ! Levaquin (Levofloxacin) 7)  ! Keflex 8)  Amoxicillin (Amoxicillin) 9)  Codeine Phosphate (Codeine Phosphate) 10)  * Hytrin (Terazosin) 11)  Niaspan (Niacin (Antihyperlipidemic))  Anticoagulation Management History:      The patient is taking warfarin and comes in today for a routine follow up visit.  Positive risk factors for bleeding include an age of 75 years or older.  The bleeding index is 'intermediate risk'.  Positive CHADS2 values include History of HTN and Age > 40 years old.  The start date was 01/10/2003.  Anticoagulation responsible provider: Riley Kill MD, Maisie Fus.  INR POC: 2.9.  Cuvette Lot#: [id number].  Exp: 12/2011.    Anticoagulation Management Assessment/Plan:      The patient's current anticoagulation dose is Coumadin 2 mg tabs: Take as directed by coumadin clinic..  The target INR is 2 - 3.  The next INR is due 01/16/2011.  Anticoagulation instructions were given to patient.  Results were reviewed/authorized by Weston Brass, PharmD.  She was notified by Linward Headland, PharmD candidate.         Prior Anticoagulation Instructions: INR 3.0  Continue same dose of 1 tablet every day except 1 1/2 tablets on Wednesday.  Recheck INR in 4 weeks.   Current Anticoagulation  Instructions: INR 2.9 (INR goal: 2-3)  Take 1 tablet everyday except 1 and 1/2 tablets on Wednesdays.

## 2010-12-27 NOTE — Miscellaneous (Signed)
Summary: Outpatient Coinsurance Notice  Outpatient Coinsurance Notice   Imported By: Marylou Mccoy 12/05/2009 15:57:53  _____________________________________________________________________  External Attachment:    Type:   Image     Comment:   External Document

## 2010-12-27 NOTE — Assessment & Plan Note (Signed)
Summary: 3 month rov   Visit Type:  3 months follow up Primary Provider:  Loreen Freud DO  CC:  Tiredness.  History of Present Illness: Patient is feeling much better.  She resigned from rehab, and feels much better.  Denies chest pain.  Just got news that a friend is really sick..  Less fatigue on this drug.    Current Medications (verified): 1)  Coumadin 2 Mg Tabs (Warfarin Sodium) .... Take As Directed By Coumadin Clinic. 2)  Sertraline Hcl 100 Mg Tabs (Sertraline Hcl) .[name].. 1 By Mouth Qd 3)  Diovan 320 Mg Tabs (Valsartan) .... Take 1/2 Tablet Daily 4)  Carbidopa-Levodopa 25-100 Mg  Tabs (Carbidopa-Levodopa) .... Take One and One Half Tablet Every Morning, One and One Half At [facility], One Half Tablet Every Night At Bedtime 5)  Synthroid 88 Mcg  Tabs (Levothyroxine Sodium) .[name].. 1 By Mouth Once Daily 6)  Norvasc 5 Mg Tabs (Amlodipine Besylate) .... Take One Daily 7)  Adult Aspirin Ec Low Strength 81 Mg Tbec (Aspirin) .... Take One Tablet Once Daily 8)  Folic Acid 400 Mcg Tabs (Folic Acid) .... Take 1 By Mouth Once Daily 9)  Nitrostat 0.4 Mg Subl (Nitroglycerin) .[name].. 1 Tablet Under Tongue At Onset of Chest Pain; You May Repeat Every 5 Minutes For Up To 3 Doses. 10)  Potassium Gluconate 595 Mg Cr-Tabs (Potassium Gluconate) .... Take 1 By Mouth Once Daily 11)  Propranolol Hcl Cr 80 Mg Xr24h-Cap (Propranolol Hcl) .... Take 1 Tablet By Mouth Once A Day  Allergies: 1)  ! Pcn 2)  ! Trandate (Labetalol Hcl) 3)  ! Hytrin 4)  ! Lotrel (Amlodipine Besy-Benazepril Hcl) 5)  ! Codeine 6)  ! Levaquin (Levofloxacin) 7)  ! Keflex 8)  Amoxicillin (Amoxicillin) 9)  Codeine Phosphate (Codeine Phosphate) 10)  * Hytrin (Terazosin) 11)  Niaspan (Niacin (Antihyperlipidemic))  Vital Signs:  Patient profile:   77 year old female Height:      61 inches Weight:      136.75 pounds BMI:     25.93 Pulse rate:   60 / minute Pulse rhythm:   regular Resp:     18 per minute BP sitting:   174 / 84  (left  arm) Cuff size:   regular  Vitals Entered By: Vikki Ports (August 07, 2010 11:47 AM)  Physical Exam  General:  Well developed, well nourished, in no acute distress. Head:  normocephalic and atraumatic Eyes:  PERRLA/EOM intact; conjunctiva and lids normal. Lungs:  Clear bilaterally to auscultation and percussion. Heart:  PMI non displaced.  SEM  1-2/6.  No DM.  Extremities:  No clubbing or cyanosis.  No edema. Neurologic:  Alert and oriented x 3.   EKG  Procedure date:  08/07/2010  Findings:      Atrial tracking and ventricular pacing.   PPM Specifications Following MD:  Sherryl Manges, MD     PPM Vendor:  [employer]     PPM Model Number:  1291     PPM Serial Number:  [id number] PPM DOI:  01/01/2005      Lead 1    Location: [facility]     DOI: 05/01/1998     Model #: [id number]     Serial #: [id number]     Status: active Lead 2    Location: [facility]     DOI: 05/01/1998     Model #: [id number]     Serial #: [id number]     Status: active   Indications:  SINCE NODE  ARREST      PPM Follow Up Pacer Dependent:  Yes      Episodes Coumadin:  Yes  Parameters Mode:  DDDR     Lower Rate Limit:  60     Upper Rate Limit:  120 Paced AV Delay:  300     Sensed AV Delay:  250  Impression & Recommendations:  Problem # 1:  CORONARY ARTERY BYPASS GRAFT, HX OF (ICD-V45.81) appears to be remaining stable from this point.  Problem # 2:  ATRIAL FIBRILLATION (ICD-427.31) Meds were changed by Dr. Graciela Husbands to deal with issue of fatigue. Now on long acting propranolol.  Fatigue persists, but largely some improvement overall so will continue with the medications.  Monitoring suggested recurrent AF, despite lack of symptoms as she is blocked, and paced, but corroborates decision to remain on warfarin for thromboprophylaxis.   The following medications were removed from the medication list:    Metoprolol Succinate 25 Mg Xr24h-tab (Metoprolol succinate) .[name]... Take one tablet by mouth daily Her updated medication list for this problem  includes:    Coumadin 2 Mg Tabs (Warfarin sodium) .[name]... Take as directed by coumadin clinic.    Adult Aspirin Ec Low Strength 81 Mg Tbec (Aspirin) .[name]... Take one tablet once daily    Propranolol Hcl Cr 80 Mg Xr24h-cap (Propranolol hcl) .[name]... Take 1 tablet by mouth once a day  Orders: EKG w/ Interpretation (93000)  Problem # 3:  AV BLOCK, COMPLETE (ICD-426.0) remains on permanent pacing at present. The following medications were removed from the medication list:    Metoprolol Succinate 25 Mg Xr24h-tab (Metoprolol succinate) .[name]... Take one tablet by mouth daily Her updated medication list for this problem includes:    Coumadin 2 Mg Tabs (Warfarin sodium) .[name]... Take as directed by coumadin clinic.    Norvasc 5 Mg Tabs (Amlodipine besylate) .[name]... Take one daily    Adult Aspirin Ec Low Strength 81 Mg Tbec (Aspirin) .[name]... Take one tablet once daily    Nitrostat 0.4 Mg Subl (Nitroglycerin) .[name]... 1 tablet under tongue at onset of chest pain; you may repeat every 5 minutes for up to 3 doses.    Propranolol Hcl Cr 80 Mg Xr24h-cap (Propranolol hcl) .[name]... Take 1 tablet by mouth once a day  Problem # 4:  HYPERTENSION (ICD-401.9) BP is up, but they will continue to monitor.  The following medications were removed from the medication list:    Metoprolol Succinate 25 Mg Xr24h-tab (Metoprolol succinate) .[name]... Take one tablet by mouth daily Her updated medication list for this problem includes:    Diovan 320 Mg Tabs (Valsartan) .[name]... Take 1/2 tablet daily    Norvasc 5 Mg Tabs (Amlodipine besylate) .[name]... Take one daily    Adult Aspirin Ec Low Strength 81 Mg Tbec (Aspirin) .[name]... Take one tablet once daily    Propranolol Hcl Cr 80 Mg Xr24h-cap (Propranolol hcl) .[name]... Take 1 tablet by mouth once a day  Problem # 5:  HYPERCHOLESTEROLEMIA  IIA (ICD-272.0) Not on statin therapy. Defer to primary MDs.   Patient Instructions: 1)  Your physician recommends that you schedule a follow-up appointment in: December  with [facility] and February with Dr Riley Kill 2)  Your physician recommends that you have lab work in: December (BMP 427.31, 414.00) 3)  Your physician recommends that you continue on your current medications as directed. Please refer to the Current Medication list given to you today.

## 2010-12-27 NOTE — Medication Information (Signed)
Summary: rov/ln  Anticoagulant Therapy  Managed by: Weston Brass, PharmD Referring MD: Shawnie Pons MD PCP: Loreen Freud DO Supervising MD: Clifton James MD, Cristal Deer Indication 1: Atrial Fibrillation (ICD-427.31) Lab Used: [facility] [place] Site: [address] INR POC 2.6 INR RANGE 2 - 3  Dietary changes: no    Health status changes: no    Bleeding/hemorrhagic complications: no    Recent/future hospitalizations: no    Any changes in medication regimen? no    Recent/future dental: no  Any missed doses?: no       Is patient compliant with meds? yes       Allergies: 1)  ! Pcn 2)  ! Trandate (Labetalol Hcl) 3)  ! Hytrin 4)  ! Lotrel (Amlodipine Besy-Benazepril Hcl) 5)  ! Codeine 6)  ! Levaquin (Levofloxacin) 7)  ! Keflex 8)  Amoxicillin (Amoxicillin) 9)  Codeine Phosphate (Codeine Phosphate) 10)  * Hytrin (Terazosin) 11)  Niaspan (Niacin (Antihyperlipidemic))  Anticoagulation Management History:      The patient is taking warfarin and comes in today for a routine follow up visit.  Positive risk factors for bleeding include an age of 75 years or older.  The bleeding index is 'intermediate risk'.  Positive CHADS2 values include History of HTN and Age > 3 years old.  The start date was 01/10/2003.  Anticoagulation responsible provider: Clifton James MD, Cristal Deer.  INR POC: 2.6.  Cuvette Lot#: [id number].  Exp: 08/2011.    Anticoagulation Management Assessment/Plan:      The patient's current anticoagulation dose is Coumadin 2 mg tabs: Take as directed by coumadin clinic..  The target INR is 2 - 3.  The next INR is due 09/04/2010.  Anticoagulation instructions were given to patient.  Results were reviewed/authorized by Weston Brass, PharmD.  She was notified by Harrel Carina, PharmD candidate.         Prior Anticoagulation Instructions: INR 2.5  Continue same dose of 1 tab daily except for 1.5 tabs on Wednesday.  Re-check in 4 weeks.  Current Anticoagulation  Instructions: INR 2.6  Continue taking 1 tablet everyday except take 1 1/2 tablets on Wednesdays. Re-check INR in 4 weeks.

## 2010-12-27 NOTE — Miscellaneous (Signed)
Summary: Flu/[facility]  Flu/[facility]   Imported By: Lanelle Bal 10/02/2010 14:06:05  _____________________________________________________________________  External Attachment:    Type:   Image     Comment:   External Document

## 2010-12-27 NOTE — Assessment & Plan Note (Signed)
Summary: ROV   Visit Type:  Follow-up Primary Provider:  Loreen Freud DO  CC:  Blood pressure issues- Tiredness.  History of Present Illness: Patient had severe pain in R of head, and BP has not been well controlled since going off Beta blockers.  Wants to take medications.  She feels rather fatigue.  No chest pain.  Pain was in temple, but BP really high.    Current Medications (verified): 1)  Coumadin 2 Mg Tabs (Warfarin Sodium) .... Take As Directed By Coumadin Clinic. 2)  Sertraline Hcl 100 Mg Tabs (Sertraline Hcl) .[name].. 1 By Mouth Qd 3)  Diovan 320 Mg Tabs (Valsartan) .... Take 1/2 Tablet Daily 4)  Carbidopa-Levodopa 25-100 Mg  Tabs (Carbidopa-Levodopa) .... Take One and One Half Tablet Every Morning, One and One Half At [facility], One Half Tablet Every Night At Bedtime 5)  Synthroid 88 Mcg  Tabs (Levothyroxine Sodium) .[name].. 1 By Mouth Once Daily 6)  Norvasc 5 Mg Tabs (Amlodipine Besylate) .... Take One Daily 7)  Adult Aspirin Ec Low Strength 81 Mg Tbec (Aspirin) .... Take One Tablet Once Daily 8)  Folic Acid 400 Mcg Tabs (Folic Acid) .... Take 1 By Mouth Once Daily 9)  Nitrostat 0.4 Mg Subl (Nitroglycerin) .[name].. 1 Tablet Under Tongue At Onset of Chest Pain; You May Repeat Every 5 Minutes For Up To 3 Doses. 10)  Potassium Gluconate 595 Mg Cr-Tabs (Potassium Gluconate) .... Take 1 By Mouth Once Daily  Allergies: 1)  ! Pcn 2)  ! Trandate (Labetalol Hcl) 3)  ! Hytrin 4)  ! Lotrel (Amlodipine Besy-Benazepril Hcl) 5)  ! Codeine 6)  ! Levaquin (Levofloxacin) 7)  ! Keflex 8)  Amoxicillin (Amoxicillin) 9)  Codeine Phosphate (Codeine Phosphate) 10)  * Hytrin (Terazosin) 11)  Niaspan (Niacin (Antihyperlipidemic))  Vital Signs:  Patient profile:   77 year old female Height:      61 inches Weight:      137.75 pounds BMI:     26.12 Pulse rate:   60 / minute Pulse (ortho):   64 / minute Pulse rhythm:   regular Resp:     18 per minute BP sitting:   190 / 80  (left arm) BP standing:   164  / 71 Cuff size:   regular  Vitals Entered By: Vikki Ports (March 09, 2010 11:02 AM)  Serial Vital Signs/Assessments:  Time      Position  BP       Pulse  Resp  Temp     By           Lying RA  188/72   59                    Luz Jaramillo           Sitting   195/75   62                    Luz Jaramillo           Standing  164/71   64                    Luz Jaramillo           Standing  187/73   64                    Luz Jaramillo 12:24 PM  Standing  171/68   64  Vikki Ports   Physical Exam  General:  Well developed, well nourished, in no acute distress. Head:  normocephalic and atraumatic Eyes:  PERRLA/EOM intact; conjunctiva and lids normal. Lungs:  Clear bilaterally to auscultation and percussion. Heart:  PMI non displaced.  Slight increase in P2.  No rub. Extremities:  No clubbing or cyanosis. Neurologic:  Alert and oriented x 3.   EKG  Procedure date:  03/09/2010  Findings:      AV pacing.  PPM Specifications Following MD:  Sherryl Manges, MD     PPM Vendor:  [employer]     PPM Model Number:  [id number]     PPM Serial Number:  [id number] PPM DOI:  01/01/2005      Lead 1    Location: RA     DOI: 05/01/1998     Model #: 6940     Serial #: [id number]     Status: active Lead 2    Location: RV     DOI: 05/01/1998     Model #: 1346     Serial #: [id number]     Status: active   Indications:  SINCE NODE ARREST      PPM Follow Up Remote Check?  No Battery Voltage:  MOL2 V     Battery Est. Longevity:  3.5 YEARS     Pacer Dependent:  Yes       PPM Device Measurements Atrium  Amplitude: 1.5 mV, Impedance: 710 ohms, Threshold: 0.7 V at 0.4 msec Right Ventricle  Amplitude: PACED AT 30 mV, Impedance: 780 ohms, Threshold: 1.0 V at 0.4 msec  Episodes MS Episodes:  250     Percent Mode Switch:  0%     Coumadin:  Yes Ventricular High Rate:  0     Atrial Pacing:  79%     Ventricular Pacing:  21%  Parameters Mode:  DDDR     Lower Rate Limit:  60     Upper Rate Limit:   120 Paced AV Delay:  300     Sensed AV Delay:  250 Next Cardiology Appt Due:  08/25/2010 Tech Comments:  Normal device function.  Mode switch episodes comprise 0% of total time.  Longest episode 54 minutes and are likely frequent PAC's as opposed to afib.  ROV 6 months clinic. Gypsy Balsam RN BSN  March 09, 2010 11:29 AM   Impression & Recommendations:  Problem # 1:  HYPERTENSION (ICD-401.9) Not well controlled.  She wants to resume beta blocker.  Will add low dose metoprolol which she thinks will help.   Her updated medication list for this problem includes:    Diovan 320 Mg Tabs (Valsartan) .[name]... Take 1/2 tablet daily    Norvasc 5 Mg Tabs (Amlodipine besylate) .[name]... Take one daily    Adult Aspirin Ec Low Strength 81 Mg Tbec (Aspirin) .[name]... Take one tablet once daily    Metoprolol Tartrate 25 Mg Tabs (Metoprolol tartrate) .[name]... Take one tablet by mouth twice a day  Problem # 2:  CORONARY ARTERY DISEASE (ICD-414.00)  stable.  No chest pain at present.   Her updated medication list for this problem includes:    Coumadin 2 Mg Tabs (Warfarin sodium) .[name]... Take as directed by coumadin clinic.    Norvasc 5 Mg Tabs (Amlodipine besylate) .[name]... Take one daily    Adult Aspirin Ec Low Strength 81 Mg Tbec (Aspirin) .[name]... Take one tablet once daily    Nitrostat 0.4 Mg Subl (Nitroglycerin) .[name]... 1 tablet under tongue at onset of  chest pain; you may repeat every 5 minutes for up to 3 doses.    Metoprolol Tartrate 25 Mg Tabs (Metoprolol tartrate) .[name]... Take one tablet by mouth twice a day  Orders: EKG w/ Interpretation (93000)  Problem # 3:  HYPERLIPIDEMIA (ICD-272.4) currently on no treatment.  Patient Instructions: 1)  Your physician recommends that you schedule a follow-up appointment in: 2-3 WEEKS 2)  Your physician has recommended you make the following change in your medication: START Metoprolol Tartrate 25mg  one tablet by mouth two times a day Prescriptions: METOPROLOL TARTRATE 25 MG TABS  (METOPROLOL TARTRATE) Take one tablet by mouth twice a day  #60 x 3   Entered by:   Julieta Gutting, RN, BSN   Authorized by:   Ronaldo Miyamoto, MD, [facility]   Signed by:   Julieta Gutting, RN, BSN on 03/09/2010   Method used:   Electronically to        [id number] (retail)       [address]       [place], [place]  [address]       Ph: [phone] or [phone]       Fax: [phone]   RxID:   [id number]

## 2010-12-27 NOTE — Medication Information (Signed)
Summary: rov/ewj  Anticoagulant Therapy  Managed by: Cloyde Reams, RN, BSN Referring MD: Shawnie Pons MD PCP: Loreen Freud DO Supervising MD: Eden Emms MD, Theron Arista Indication 1: Atrial Fibrillation (ICD-427.31) Lab Used: [facility] [place] Site: [address] INR POC 2.1 INR RANGE 2 - 3  Dietary changes: no    Health status changes: no    Bleeding/hemorrhagic complications: no    Recent/future hospitalizations: no    Any changes in medication regimen? no    Recent/future dental: no  Any missed doses?: no       Is patient compliant with meds? yes       Allergies: 1)  ! Pcn 2)  ! Trandate (Labetalol Hcl) 3)  ! Hytrin 4)  ! Lotrel (Amlodipine Besy-Benazepril Hcl) 5)  ! Codeine 6)  ! Levaquin (Levofloxacin) 7)  ! Keflex 8)  Amoxicillin (Amoxicillin) 9)  Codeine Phosphate (Codeine Phosphate) 10)  * Hytrin (Terazosin) 11)  Niaspan (Niacin (Antihyperlipidemic))  Anticoagulation Management History:      The patient is taking warfarin and comes in today for a routine follow up visit.  Positive risk factors for bleeding include an age of 14 years or older.  The bleeding index is 'intermediate risk'.  Positive CHADS2 values include History of HTN and Age > 48 years old.  The start date was 01/10/2003.  Anticoagulation responsible provider: Eden Emms MD, Theron Arista.  INR POC: 2.1.  Cuvette Lot#: [id number].  Exp: 03/2011.    Anticoagulation Management Assessment/Plan:      The patient's current anticoagulation dose is Coumadin 2 mg tabs: Take as directed by coumadin clinic..  The target INR is 2 - 3.  The next INR is due 03/21/2010.  Anticoagulation instructions were given to patient.  Results were reviewed/authorized by Cloyde Reams, RN, BSN.  She was notified by Cloyde Reams RN.         Prior Anticoagulation Instructions: INR 2.2  Continue on same dosage 1 tablet daily except 1.5 tablets on Wednesdays.  Recheck in 4 weeks.    Current Anticoagulation Instructions: INR  2.1  Continue on same dosage 1 tablet daily except 1.5 tablets on Wednesdays.  Recheck in 4 weeks.

## 2010-12-27 NOTE — Letter (Signed)
Summary: Primary Care Appointment Letter  [facility]  [address] [place], [place] [address]   Phone: [phone]  Fax: [phone]    12/08/2009 MRN: [id number]  Colleen Morris [address] [place], [place]  [address]  Dear Ms. Hale Bogus,   Your Primary Care Physician Loreen Freud DO has indicated that:    ___X____it is time to schedule an appointment. (Labwork is needed only.)    _______you missed your appointment on______ and need to call and          reschedule.    _______you need to have lab work done.    _______you need to schedule an appointment discuss lab or test results.    _______you need to call to reschedule your appointment that is                       scheduled on _________.     Please call our office as soon as possible. Our phone number is [phone]          _________. Please press option 1. Our office is open 8a-12noon and 1p-5p, Monday through Friday.     Thank you,     Primary Care Scheduler

## 2010-12-27 NOTE — Miscellaneous (Signed)
Summary: update med  Clinical Lists Changes  Medications: Changed medication from DIOVAN 160 MG TABS (VALSARTAN) 1 by mouth once daily to DIOVAN 320 MG TABS (VALSARTAN) take 1/2 tablet daily

## 2010-12-27 NOTE — Cardiovascular Report (Signed)
Summary: Office Visit   Office Visit   Imported By: Roderic Ovens 05/18/2010 09:49:37  _____________________________________________________________________  External Attachment:    Type:   Image     Comment:   External Document

## 2010-12-27 NOTE — Medication Information (Signed)
Summary: rov/nb  Anticoagulant Therapy  Managed by: Weston Brass, PharmD Referring MD: Shawnie Pons MD PCP: Loreen Freud DO Supervising MD: Shirlee Latch MD, Freida Busman Indication 1: Atrial Fibrillation (ICD-427.31) Lab Used: [facility] [place] Site: [address] INR POC 3.0 INR RANGE 2 - 3  Dietary changes: no    Health status changes: no    Bleeding/hemorrhagic complications: no    Recent/future hospitalizations: no    Any changes in medication regimen? no    Recent/future dental: no  Any missed doses?: no       Is patient compliant with meds? yes       Allergies: 1)  ! Pcn 2)  ! Trandate (Labetalol Hcl) 3)  ! Hytrin 4)  ! Lotrel (Amlodipine Besy-Benazepril Hcl) 5)  ! Codeine 6)  ! Levaquin (Levofloxacin) 7)  ! Keflex 8)  Amoxicillin (Amoxicillin) 9)  Codeine Phosphate (Codeine Phosphate) 10)  * Hytrin (Terazosin) 11)  Niaspan (Niacin (Antihyperlipidemic))  Anticoagulation Management History:      The patient is taking warfarin and comes in today for a routine follow up visit.  Positive risk factors for bleeding include an age of 75 years or older.  The bleeding index is 'intermediate risk'.  Positive CHADS2 values include History of HTN and Age > 29 years old.  The start date was 01/10/2003.  Anticoagulation responsible provider: Shirlee Latch MD, [place].  INR POC: 3.0.  Cuvette Lot#: [id number].  Exp: 12/2011.    Anticoagulation Management Assessment/Plan:      The patient's current anticoagulation dose is Coumadin 2 mg tabs: Take as directed by coumadin clinic..  The target INR is 2 - 3.  The next INR is due 12/05/2010.  Anticoagulation instructions were given to patient.  Results were reviewed/authorized by Weston Brass, PharmD.  She was notified by Weston Brass PharmD.         Prior Anticoagulation Instructions: INR 2.5 Continue previous dose of 1 tablet everyday except 1.5 tablets on Wednesday Recheck INR in 4 weeks  Current Anticoagulation Instructions: INR  3.0  Continue same dose of 1 tablet every day except 1 1/2 tablets on Wednesday.  Recheck INR in 4 weeks.

## 2010-12-27 NOTE — Progress Notes (Signed)
Summary: Diovan refill  Phone Note Call from Patient   Caller: Patient Call For: Dr Weston Settle Summary of Call: Needs refill on Diovan rx, Requesting  rx for 320mg  tablets #30, pt halves tablets, sent [facility] Initial call taken by: Cloyde Reams RN,  February 21, 2010 10:26 AM  Follow-up for Phone Call        Rx faxed to pharmacy Follow-up by: Vikki Ports,  February 21, 2010 4:50 PM

## 2011-01-14 DIAGNOSIS — I4891 Unspecified atrial fibrillation: Secondary | ICD-10-CM

## 2011-01-16 ENCOUNTER — Encounter (INDEPENDENT_AMBULATORY_CARE_PROVIDER_SITE_OTHER): Payer: Medicare Other

## 2011-01-16 ENCOUNTER — Encounter: Payer: Self-pay | Admitting: Cardiology

## 2011-01-16 ENCOUNTER — Ambulatory Visit (INDEPENDENT_AMBULATORY_CARE_PROVIDER_SITE_OTHER): Payer: Medicare Other | Admitting: Cardiology

## 2011-01-16 ENCOUNTER — Other Ambulatory Visit: Payer: Self-pay | Admitting: Cardiology

## 2011-01-16 DIAGNOSIS — Z7901 Long term (current) use of anticoagulants: Secondary | ICD-10-CM

## 2011-01-16 DIAGNOSIS — E785 Hyperlipidemia, unspecified: Secondary | ICD-10-CM

## 2011-01-16 DIAGNOSIS — I10 Essential (primary) hypertension: Secondary | ICD-10-CM

## 2011-01-16 DIAGNOSIS — I2510 Atherosclerotic heart disease of native coronary artery without angina pectoris: Secondary | ICD-10-CM

## 2011-01-16 DIAGNOSIS — I4891 Unspecified atrial fibrillation: Secondary | ICD-10-CM

## 2011-01-16 LAB — BASIC METABOLIC PANEL
BUN: 33 mg/dL — ABNORMAL HIGH (ref 6–23)
Calcium: 9.2 mg/dL (ref 8.4–10.5)
Creatinine, Ser: 1.4 mg/dL — ABNORMAL HIGH (ref 0.4–1.2)
GFR: 38.27 mL/min — ABNORMAL LOW (ref >60.00)
Glucose, Bld: 87 mg/dL (ref 70–99)

## 2011-01-16 LAB — CBC WITH DIFFERENTIAL/PLATELET
Basophils Absolute: 0 10*3/uL (ref 0.0–0.1)
Lymphocytes Relative: 10.1 % — ABNORMAL LOW (ref 12.0–46.0)
Monocytes Relative: 3.2 % (ref 3.0–12.0)
Platelets: 237 10*3/uL (ref 150.0–400.0)
RDW: 14.8 % — ABNORMAL HIGH (ref 11.5–14.6)

## 2011-01-16 LAB — CONVERTED CEMR LAB: POC INR: 2.8

## 2011-01-22 NOTE — Medication Information (Signed)
Summary: ROV/ejm  Anticoagulant Therapy  Managed by: Georgina Pillion, PharmD Referring MD: Shawnie Pons MD PCP: Loreen Freud DO Supervising MD: Shirlee Latch MD, Freida Busman Indication 1: Atrial Fibrillation (ICD-427.31) Lab Used: [facility]  Site: [address] INR POC 2.8 INR RANGE 2 - 3  Dietary changes: no    Health status changes: no    Bleeding/hemorrhagic complications: no    Recent/future hospitalizations: no    Any changes in medication regimen? no    Recent/future dental: no  Any missed doses?: no       Is patient compliant with meds? yes       Allergies: 1)  ! Pcn 2)  ! Trandate (Labetalol Hcl) 3)  ! Hytrin 4)  ! Lotrel (Amlodipine Besy-Benazepril Hcl) 5)  ! Codeine 6)  ! Levaquin (Levofloxacin) 7)  ! Keflex 8)  Amoxicillin (Amoxicillin) 9)  Codeine Phosphate (Codeine Phosphate) 10)  * Hytrin (Terazosin) 11)  Niaspan (Niacin (Antihyperlipidemic))  Anticoagulation Management History:      Positive risk factors for bleeding include an age of 3 years or older.  The bleeding index is 'intermediate risk'.  Positive CHADS2 values include History of HTN and Age > 27 years old.  The start date was 01/10/2003.  Anticoagulation responsible provider: Shirlee Latch MD, Arion Shankles.  INR POC: 2.8.  Cuvette Lot#: [id number].  Exp: 11/2011.    Anticoagulation Management Assessment/Plan:      The patient's current anticoagulation dose is Coumadin 2 mg tabs: Take as directed by coumadin clinic..  The target INR is 2 - 3.  The next INR is due 02/13/2011.  Anticoagulation instructions were given to patient.  Results were reviewed/authorized by Georgina Pillion, PharmD.         Prior Anticoagulation Instructions: INR 2.9 (INR goal: 2-3)  Take 1 tablet everyday except 1 and 1/2 tablets on Wednesdays.  Current Anticoagulation Instructions: Continue current regimen of 1 tablet (2 mg) daily EXCEPT for 1 1/2 tablets (3 mg) on Wednesdays only.  INR 2.8

## 2011-01-23 ENCOUNTER — Other Ambulatory Visit: Payer: Medicare Other

## 2011-01-29 ENCOUNTER — Encounter: Payer: Self-pay | Admitting: Internal Medicine

## 2011-01-29 DIAGNOSIS — I495 Sick sinus syndrome: Secondary | ICD-10-CM

## 2011-01-31 NOTE — Assessment & Plan Note (Addendum)
Summary: rov   Visit Type:  Follow-up Primary Provider:  Loreen Freud DO  CC:  pt has been feeling flutters during the night. pt has no other complaints today. pt did not have med list today and meds verified verbally.[name]  History of Present Illness: Overall doing well.  No major complaints.  Denies shortness of breath.  Rare to ocassional flutters at night.    Problems Prior to Update: 1)  Atrial Fibrillation  (ICD-427.31) 2)  Coronary Artery Disease  (ICD-414.00) 3)  Coronary Artery Bypass Graft, Hx of  (ICD-V45.81) 4)  Pacemaker, Permanent-guidant Insignia 1291  (ICD-V45.01) 5)  Hypotension, Orthostatic  (ICD-458.0) 6)  Hypertension  (ICD-401.9) 7)  Hyperlipidemia  (ICD-272.4) 8)  Carotid Bruit, Right  (ICD-785.9) 9)  Renal Insufficiency  (ICD-588.9) 10)  Depression  (ICD-311) 11)  Anxiety  (ICD-300.00) 12)  Av Block, Complete  (ICD-426.0) 13)  Sinoatrial Node Dysfunction  (ICD-427.81) 14)  Dizziness  (ICD-780.4) 15)  Hypercholesterolemia Iia  (ICD-272.0) 16)  Cough  (ICD-786.2) 17)  Sinusitis - Acute-nos  (ICD-461.9) 18)  Cataract Extraction, Hx of  (ICD-V45.61) 19)  Polypectomy, Hx of  (ICD-V15.9) 20)  Parkinson's Disease  (ICD-332.0) 21)  Right L4-5 Far-lateral Metrix Microdiscectomy  () 22)  Hypothyroidism  (ICD-244.9) 23)  Low Back Pain Syndrome  (ICD-724.2) 24)  Femoral Bruit  (ICD-785.9) 25)  Sleep Apnea  (ICD-780.57) 26)  Diverticulosis, Colon  (ICD-562.10) 27)  Anemia-nos  (ICD-285.9)  Current Medications (verified): 1)  Coumadin 2 Mg Tabs (Warfarin Sodium) .... Take As Directed By Coumadin Clinic. 2)  Sertraline Hcl 100 Mg Tabs (Sertraline Hcl) .[name].. 1 By Mouth Qd 3)  Diovan 320 Mg Tabs (Valsartan) .... Take 1/2 Tablet Daily 4)  Carbidopa-Levodopa 25-100 Mg  Tabs (Carbidopa-Levodopa) .... Take One and One Half Tablet Every Morning, One and One Half At [facility], One Half Tablet Every Night At Bedtime 5)  Synthroid 88 Mcg  Tabs (Levothyroxine Sodium) .[name].. 1 By Mouth  Once Daily* Labs Are Due Now* 6)  Norvasc 5 Mg Tabs (Amlodipine Besylate) .... Take One Daily 7)  Adult Aspirin Ec Low Strength 81 Mg Tbec (Aspirin) .... Take One Tablet Once Daily 8)  Folic Acid 400 Mcg Tabs (Folic Acid) .... Take 1 By Mouth Once Daily 9)  Nitrostat 0.4 Mg Subl (Nitroglycerin) .[name].. 1 Tablet Under Tongue At Onset of Chest Pain; You May Repeat Every 5 Minutes For Up To 3 Doses. 10)  Potassium Gluconate 595 Mg Cr-Tabs (Potassium Gluconate) .... Take 1 By Mouth Once Daily 11)  Propranolol Hcl Cr 80 Mg Xr24h-Cap (Propranolol Hcl) .... Take 1 Tablet By Mouth Once A Day  Allergies (verified): 1)  ! Pcn 2)  ! Trandate (Labetalol Hcl) 3)  ! Hytrin 4)  ! Lotrel (Amlodipine Besy-Benazepril Hcl) 5)  ! Codeine 6)  ! Levaquin (Levofloxacin) 7)  ! Keflex 8)  Amoxicillin (Amoxicillin) 9)  Codeine Phosphate (Codeine Phosphate) 10)  * Hytrin (Terazosin) 11)  Niaspan (Niacin (Antihyperlipidemic))  Vital Signs:  Patient profile:   77 year old female Height:      61 inches Weight:      139.50 pounds BMI:     26.45 Pulse rate:   60 / minute Resp:     18 per minute BP sitting:   134 / 72  (left arm) Cuff size:   regular  Vitals Entered By: Celestia Khat, CMA (January 16, 2011 10:29 AM)  Physical Exam  General:  Well developed, well nourished, in no acute distress. Head:  normocephalic and  atraumatic Eyes:  Small conjuctival hem Neck:  no JVD Lungs:  Clear bilaterally to auscultation and percussion. Heart:  Normal S1 and S2.  Soft SEM.  No DM. Pulses:  pulses normal in all 4 extremities Extremities:  No clubbing or cyanosis. Neurologic:  Alert and oriented x 3.   PPM Specifications Following MD:  Sherryl Manges, MD     PPM Vendor:  [employer]     PPM Model Number:  1291     PPM Serial Number:  [id number] PPM DOI:  01/01/2005      Lead 1    Location: RA     DOI: 05/01/1998     Model #: 6940     Serial #: [id number]     Status: active Lead 2    Location: RV     DOI: 05/01/1998      Model #: 1346     Serial #: [id number]     Status: active   Indications:  SINCE NODE ARREST      PPM Follow Up Pacer Dependent:  Yes      Episodes Coumadin:  Yes  Parameters Mode:  DDDR     Lower Rate Limit:  60     Upper Rate Limit:  120 Paced AV Delay:  300     Sensed AV Delay:  250  Impression & Recommendations:  Problem # 1:  CORONARY ARTERY BYPASS GRAFT, HX OF (ICD-V45.81) No sig symptoms  Problem # 2:  ATRIAL FIBRILLATION (ICD-427.31)  currently in NSRS, but paroxsymal.  Her updated medication list for this problem includes:    Coumadin 2 Mg Tabs (Warfarin sodium) .[name]... Take as directed by coumadin clinic.    Adult Aspirin Ec Low Strength 81 Mg Tbec (Aspirin) .[name]... Take one tablet once daily    Propranolol Hcl Cr 80 Mg Xr24h-cap (Propranolol hcl) .[name]... Take 1 tablet by mouth once a day  Orders: EKG w/ Interpretation (93000) TLB-BMP (Basic Metabolic Panel-BMET) (80048-METABOL) TLB-CBC Platelet - w/Differential (85025-CBCD)  Problem # 3:  HYPERTENSION (ICD-401.9)  controlled Her updated medication list for this problem includes:    Diovan 320 Mg Tabs (Valsartan) .[name]... Take 1/2 tablet daily    Norvasc 5 Mg Tabs (Amlodipine besylate) .[name]... Take one daily    Adult Aspirin Ec Low Strength 81 Mg Tbec (Aspirin) .[name]... Take one tablet once daily    Propranolol Hcl Cr 80 Mg Xr24h-cap (Propranolol hcl) .[name]... Take 1 tablet by mouth once a day  Orders: EKG w/ Interpretation (93000) TLB-BMP (Basic Metabolic Panel-BMET) (80048-METABOL) TLB-CBC Platelet - w/Differential (85025-CBCD)  Problem # 4:  HYPERLIPIDEMIA (ICD-272.4)  Not on a statin.  Orders: EKG w/ Interpretation (93000) TLB-BMP (Basic Metabolic Panel-BMET) (80048-METABOL) TLB-CBC Platelet - w/Differential (85025-CBCD)  Problem # 5:  CAROTID BRUIT, RIGHT (ICD-785.9) See recent dopplers.  Patient Instructions: 1)  Your physician recommends that you have lab work today: CBC, BMP 2)  Your physician recommends that  you continue on your current medications as directed. Please refer to the Current Medication list given to you today. 3)  Your physician wants you to follow-up in: 6 MONTHS.   You will receive a reminder letter in the mail two months in advance. If you don't receive a letter, please call our office to schedule the follow-up appointment.

## 2011-02-05 ENCOUNTER — Encounter: Payer: Self-pay | Admitting: Cardiology

## 2011-02-05 ENCOUNTER — Other Ambulatory Visit (INDEPENDENT_AMBULATORY_CARE_PROVIDER_SITE_OTHER): Payer: Medicare Other

## 2011-02-05 ENCOUNTER — Other Ambulatory Visit: Payer: Self-pay | Admitting: Cardiology

## 2011-02-05 DIAGNOSIS — I4891 Unspecified atrial fibrillation: Secondary | ICD-10-CM

## 2011-02-05 DIAGNOSIS — I2510 Atherosclerotic heart disease of native coronary artery without angina pectoris: Secondary | ICD-10-CM

## 2011-02-05 DIAGNOSIS — I10 Essential (primary) hypertension: Secondary | ICD-10-CM

## 2011-02-05 LAB — CBC WITH DIFFERENTIAL/PLATELET
Basophils Relative: 0.7 % (ref 0.0–3.0)
Eosinophils Absolute: 0.3 10*3/uL (ref 0.0–0.7)
MCHC: 34.3 g/dL (ref 30.0–36.0)
MCV: 98 fl (ref 78.0–100.0)
Monocytes Absolute: 0.6 10*3/uL (ref 0.1–1.0)
Neutrophils Relative %: 66.6 % (ref 43.0–77.0)
Platelets: 226 10*3/uL (ref 150.0–400.0)
RDW: 15.1 % — ABNORMAL HIGH (ref 11.5–14.6)

## 2011-02-12 NOTE — Cardiovascular Report (Signed)
Summary: TTM   TTM   Imported By: Roderic Ovens 02/07/2011 15:48:40  _____________________________________________________________________  External Attachment:    Type:   Image     Comment:   External Document

## 2011-02-13 ENCOUNTER — Encounter: Payer: Medicare Other | Admitting: *Deleted

## 2011-02-27 LAB — CBC
HCT: 35.8 % — ABNORMAL LOW (ref 36.0–46.0)
Hemoglobin: 12 g/dL (ref 12.0–15.0)
MCHC: 34.2 g/dL (ref 30.0–36.0)
RBC: 3.64 MIL/uL — ABNORMAL LOW (ref 3.87–5.11)
WBC: 5.4 10*3/uL (ref 4.0–10.5)
WBC: 6.5 10*3/uL (ref 4.0–10.5)

## 2011-02-27 LAB — BASIC METABOLIC PANEL
BUN: 19 mg/dL (ref 6–23)
CO2: 27 mEq/L (ref 19–32)
Calcium: 9.4 mg/dL (ref 8.4–10.5)
Chloride: 106 mEq/L (ref 96–112)
Creatinine, Ser: 1.32 mg/dL — ABNORMAL HIGH (ref 0.4–1.2)
GFR calc Af Amer: 47 mL/min — ABNORMAL LOW (ref >60)
GFR calc non Af Amer: 39 mL/min — ABNORMAL LOW (ref >60)
Glucose, Bld: 89 mg/dL (ref 70–99)
Potassium: 4.1 mEq/L (ref 3.5–5.1)

## 2011-02-27 LAB — APTT: aPTT: 34 seconds (ref 24–37)

## 2011-02-27 LAB — CARDIAC PANEL(CRET KIN+CKTOT+MB+TROPI)
CK, MB: 1.2 ng/mL (ref 0.3–4.0)
CK, MB: 1.6 ng/mL (ref 0.3–4.0)
Total CK: 37 U/L (ref 7–177)
Total CK: 42 U/L (ref 7–177)
Troponin I: 0.04 ng/mL (ref 0.00–0.06)

## 2011-02-27 LAB — POCT CARDIAC MARKERS
CKMB, poc: <1 ng/mL — ABNORMAL LOW (ref 1.0–8.0)
CKMB, poc: <1 ng/mL — ABNORMAL LOW (ref 1.0–8.0)
Myoglobin, poc: 102 ng/mL (ref 12–200)
Myoglobin, poc: 86.8 ng/mL (ref 12–200)
Troponin i, poc: <0.05 ng/mL (ref 0.00–0.09)
Troponin i, poc: <0.05 ng/mL (ref 0.00–0.09)

## 2011-02-27 LAB — POCT I-STAT, CHEM 8
Chloride: 106 mEq/L (ref 96–112)
Glucose, Bld: 93 mg/dL (ref 70–99)
HCT: 37 % (ref 36.0–46.0)
Potassium: 4 mEq/L (ref 3.5–5.1)

## 2011-02-27 LAB — DIFFERENTIAL
Basophils Relative: 1 % (ref 0–1)
Monocytes Relative: 8 % (ref 3–12)
Neutro Abs: 4.3 10*3/uL (ref 1.7–7.7)
Neutrophils Relative %: 67 % (ref 43–77)

## 2011-02-27 LAB — LIPID PANEL
Cholesterol: 312 mg/dL — ABNORMAL HIGH (ref 0–200)
HDL: 49 mg/dL (ref >39)

## 2011-02-27 LAB — BRAIN NATRIURETIC PEPTIDE: Pro B Natriuretic peptide (BNP): 97 pg/mL (ref 0.0–100.0)

## 2011-02-27 LAB — CK TOTAL AND CKMB (NOT AT ARMC): Total CK: 46 U/L (ref 7–177)

## 2011-02-27 LAB — PROTIME-INR: INR: 1.75 — ABNORMAL HIGH (ref 0.00–1.49)

## 2011-03-11 ENCOUNTER — Other Ambulatory Visit: Payer: Self-pay | Admitting: Family Medicine

## 2011-04-07 ENCOUNTER — Other Ambulatory Visit: Payer: Self-pay | Admitting: Family Medicine

## 2011-04-08 ENCOUNTER — Ambulatory Visit (INDEPENDENT_AMBULATORY_CARE_PROVIDER_SITE_OTHER): Payer: Medicare Other | Admitting: *Deleted

## 2011-04-08 DIAGNOSIS — I4891 Unspecified atrial fibrillation: Secondary | ICD-10-CM

## 2011-04-08 LAB — POCT INR: INR: 1

## 2011-04-09 NOTE — Assessment & Plan Note (Signed)
[facility]                            CARDIOLOGY OFFICE NOTE   Colleen, Morris                       MRN:          [id number]  DATE:11/03/2007                            DOB:          11/20/1927    SUBJECTIVE:  Colleen Morris is in for followup.  She really is doing quite well  at the present time.  She denies any chest pain or shortness of breath.  She is to have her carotid done today.  She does say that her blood  pressure has been staying up, and she gets woozy when she goes up on her  beta blocker.  She is now on atenolol 50 mg, 1/2 tab twice daily, but  her pressures are staying in the 160 range.   PHYSICAL EXAMINATION:  VITAL SIGNS:  Blood pressure 140/60, pulse 61,  cardiac rhythm regular.  LUNGS:  Fields are actually quite clear.   The electrocardiogram reveals a sinus rhythm with underlying left bundle  branch block.   IMPRESSION/PLAN:  The patient has had trouble when she gets lightheaded.  We will increase her Norvasc to 7.5 mg daily.  We will get a basic  metabolic profile.  We will call her regarding her carotids.  We will  see her back in followup in three months.     Arturo Morton. Riley Kill, MD, [facility]  Electronically Signed    TDS/MedQ  DD: 11/03/2007  DT: 11/03/2007  Job #: [id number]

## 2011-04-09 NOTE — Assessment & Plan Note (Signed)
[facility]                            CARDIOLOGY OFFICE NOTE   ESMAE, DONATHAN                       MRN:          [id number]  DATE:12/06/2008                            DOB:          [birth year]    Ms. Riechers in for a followup.  She had back surgery and is dramatically  better.  She has a small subconjunctival hemorrhage in her eyes.  She  has low rash on the left arm.  Cardiacwise, she is otherwise stable.  When she stands up, she gets a little lightheaded.  She has cut back on  her hydrochlorothiazide and knows that she needs to drink more.  In  addition, she saw a kidney specialist last week and a basic profile was  done.  We do not have those results.   On her examination today, the blood pressure is 136/71 supine and when  standing it does not change much.  Pulse is 77.  Lung fields are clear.  There is a small subconjunctival hemorrhage.  The cardiac rhythm is  otherwise regular.  There is a small nonblanching rash of 5 different  small spots in the left arm.  It is not pleuritic.  It is not raised.  Etiology is unknown.  Her cardiac exam is otherwise unremarkable.   Clinically, she is stable.  I have told her if she gets lightheaded and  continues that she can cut back on her Norvasc to 1 tablet a day.  She  was only using hydrochlorothiazide on a p.r.n. basis.  Furthermore,  apparently she has been stable and her back has improved.  I have told  her to hold her Coumadin for 1 day and then resume back to her regular  dose.  If the subconjunctival hemorrhage should get worse, she is to  contact us promptly.   ADDENDUM  She will be seen back in followup in 4 months     Arturo Morton. Riley Kill, MD, [facility]  Electronically Signed    TDS/MedQ  DD: 12/06/2008  DT: 12/07/2008  Job #: [id number]

## 2011-04-09 NOTE — Assessment & Plan Note (Signed)
[facility]                            CARDIOLOGY OFFICE NOTE   RANDE, DARIO                       MRN:          [id number]  DATE:07/20/2008                            DOB:          November 24, 1927    Colleen Morris is in for followup.  She is stable.  She has not been having any  ongoing chest pain or progressive shortness of breath.  She does feel  relatively well.  Her biggest problem is low back pain radiating into  the right hip, with some discomfort down the right leg.  She has  previously had a CT done to look at the low back and apparently there  was an L5-S1 issue.  Whether this is the case, currently is unclear.  She has raised questions about seeing Dr. Alwyn Ren, and Dr. Phylliss Bob is  currently out.  Cardiac wise, she seems to be getting along well without  recurrent chest pain.   MEDICATIONS:  1. Hydrochlorothiazide 25 mg one half tablet every 3 days.  2. Coumadin as directed.  3. Zetia 10 mg daily.  4. Sinemet 25/100 one-and-half at bedtime.  5. Synthroid 88 mcg daily.  6. Atenolol 50 mg one half tablet b.i.d.  7. Diovan 160 mg daily.  8. Co Q-10 100 mg daily.  9. Folic acid 400 mg daily.  10.Calcium 1200 mg daily.  11.Celexa 20 mg daily.   On physical, her blood pressure is 100/60.  The pulse is 60.  The lung  fields are actually quite clear and the cardiac rhythm is regular.  Importantly, she does have good pulses in the lower extremities.  She  has had known peripheral vascular disease with 0.9 and 0.8 respectively,  and this is what the exam feels like.  There is good tissue perfusion in  the feet.   Her electrocardiogram demonstrates atrioventricular pacing.   Terrion seems stable from a cardiac standpoint.  Currently, her major  symptoms and problems seemed to be emanating mostly from a problem  potentially with a possible disk.  She is going to call Dr. Renaldo Reel  office.  They have previously given her steroids for this.  Although, I  told her, I did not personally feel comfortable doing this.  We will  continue to follow Autry on a regular basis.  She will need to have  followup.  We will arrange this in 6 months.     Arturo Morton. Riley Kill, MD, [facility]  Electronically Signed    TDS/MedQ  DD: 07/20/2008  DT: 07/21/2008  Job #: [id number]

## 2011-04-09 NOTE — Assessment & Plan Note (Signed)
[facility]                            CARDIOLOGY OFFICE NOTE   ELYSABETH, AUST                       MRN:          [id number]  DATE:09/27/2008                            DOB:          [birth year]    Colleen Morris is in for followup visit.  She is here for surgical clearance for  a back operation.  Importantly, Colleen Morris has an extensive history of  coronary artery disease.  She underwent catheterization in 1999 with  ostial RCA, PDA, circumflex, and left main disease.  She had normal LV  function at that time.  She subsequently has undergone coronary  revascularization surgery.  She had a pacemaker placed with a generator  replacement in 2006.  Her last radionuclide imaging study was done in  2005.  At that time, she had a very small region of anterolateral  ischemia with an ejection fraction of 64%.  She had had prior LIMA to  the LAD, saphenous vein graft to the PD, PLA and saphenous vein graft to  the OM.  She has fairly limited activity because of her back, with a MET  level of clearly less than 4.  She denies any current chest pain.  Nonetheless, her risks to some extent are increased.   CURRENT MEDICATIONS:  1. Hydrochlorothiazide 25 mg one-half every third day.  2. Coumadin as directed.  3. Sinemet 25/100, 1-1/2 t.i.d.  4. Sinemet 25/100, one-half at bedtime.  5. Synthroid 88 mcg daily.  6. Atenolol 50 mg one-half b.i.d.  7. Diovan 160 mg daily.  8. CoQ10 100 mg daily.  9. Folic acid 400 mcg daily.  10.Calcium 1200 mg daily.  11.Celexa 80 mg daily.  12.Aspirin 81 mg daily.  13.Norvasc 5 mg 1-1/2 tablets daily.   PHYSICAL EXAMINATION:  GENERAL:  She is alert and oriented.  VITAL SIGNS:  Blood pressure is 139/59, pulse 60, weight is 146.  LUNGS:  Her lung fields are clear.  CARDIAC:  Her cardiac rhythm is regular.  There are some paradoxical  splitting of second heart sound.  There is a known carotid bruit.   IMPRESSION:  1. Coronary artery  disease status post coronary bypass graft surgery      in 1999.  2. Mild Cardiolite abnormality in 2005.  3. Need for surgery with current MET level less than 4.  4. History of prior pacemaker insertion with current paced rhythm.   Of note, the patient is pacer-dependent, and this has influenced her  overall treatment plan.   RECOMMENDATIONS:  At this time, I would likely recommend a Myoview prior  to her surgery.  If it is similar to 2005, we will likely allow her to  proceed.  She is at some increased risk, and it is difficult to assess  because of her limited MET level.  I have explained this to her and she  understands and consents to going ahead with Myoview.  She is pacer  dependent and this will need to be taken into consideration during her  surgery.     Arturo Morton. Riley Kill, MD, [facility]  Electronically Signed  TDS/MedQ  DD: 09/28/2008  DT: 09/28/2008  Job #: [id number]

## 2011-04-09 NOTE — Assessment & Plan Note (Signed)
[facility]                            CARDIOLOGY OFFICE NOTE   Colleen Morris, Colleen Morris                       MRN:          [id number]  DATE:02/04/2008                            DOB:          1927-10-02    [name] is in for followup.  She is doing reasonably well but her  Parkinson's has gotten much worse.  She said when she gets tickled, she  has some chest discomfort.  Basically she laughs and feels it in the  middle of her laugh.  She says her Parkinson's has gotten dramatically  worse over the past few months.  She is a little short of breath but she  attributes most of her Parkinson's.   She also had minor degree of indigestion.   PHYSICAL:  Today blood pressure is 124/60, pulse is 60.  The lung fields are clear.  There is paradoxical splitting of second heart sound. The carotid  upstrokes are brisk.  There is a known carotid bruit.  EXTREMITIES:  Reveal no edema.   Her her EKG reveals atrial ventricular pacing.   Her carotid studies were done in December.  Does have bilateral disease  but fortunately her lesions are 40-59%.   IMPRESSION:  1. Hypercholesterolemia on lipid lowering therapy.  2. Hypertension controlled.  3. Coronary disease with prior coronary bypass graft surgery.  4. Bilateral carotid artery disease.  5. History of sinus node dysfunction with pacing.   PLAN:  1. Return to clinic in 6 months.  2. Basic metabolic profile.  3. Scheduled pacemaker clinic visit.     Arturo Morton. Riley Kill, MD, [facility]  Electronically Signed    TDS/MedQ  DD: 02/04/2008  DT: 02/05/2008  Job #: [id number]

## 2011-04-09 NOTE — Assessment & Plan Note (Signed)
[facility]                         ELECTROPHYSIOLOGY OFFICE NOTE   VANESHA, ATHENS                       MRN:          [id number]  DATE:04/26/2008                            DOB:          [birth year]    [name] comes in following pacemaker implantation years ago, for  which she underwent generator replacement in 2006.  She has had no  intercurrent problems.  She remains very active at cardiac rehab.  Her  biggest issue is the intercurrent diagnosis of Parkinson's.   She is currently taking Celexa, Sinemet, Diovan 160, Zetia 10, and  Crestor 20.   PHYSICAL EXAMINATION:  VITAL SIGNS:  Her blood pressure was elevated  today at 168/82, but this was quite distinct from her previous one of  124/60, her pulse was 61.  LUNGS:  Clear.  HEART:  Sounds were regular.  EXTREMITIES:  Without edema.   Interrogation of her Guidant pulse generator demonstrates no intrinsic  atrial or ventricular rhythm.  Her threshold was 1 volt at 0.4 in the  atrium, with an impedance of 670, and in the ventricle it was 0.4 at  0.4, with an impedance of 720.   IMPRESSION:  1. Sinus node dysfunction  2. Interval development of device dependence.  3. Coronary artery disease, with prior bypass grafting  4. Hypertension.   Mrs. Greening is doing really very well from an arrhythmia point of view.  Her device is functioning normally.   We will plan to see again in 1 year's time and follow her remotely in  the interim.     Duke Salvia, MD, [facility]  Electronically Signed    SCK/MedQ  DD: 04/26/2008  DT: 04/26/2008  Job #: [id number]

## 2011-04-09 NOTE — Assessment & Plan Note (Signed)
[facility]                            CARDIOLOGY OFFICE NOTE   Colleen, Morris                       MRN:          [id number]  DATE:05/12/2007                            DOB:          1927/01/25    CARDIOLOGIST:  Dr. Riley Kill   PRIMARY CARE PHYSICIAN:  Dr. Alwyn Ren   HISTORY OF PRESENT ILLNESS:  [name] is a very pleasant 77 year old  female followed by Dr. Riley Kill with a history of coronary artery disease  status post CABG in 1999, who presents to the office today with  complaints of dizziness.  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  Her blood  pressure was 78/50 this morning.  She had a significant blood pressure  drop when she stood.  Her pressure went from 151/74 to 95/60.  She felt  dizzy and lightheaded and weak.  She was given several cups of water to  drink and set up for an appointment today.  She now denies any further  symptoms.  She denies any syncope.  She denies any chest pain or  shortness of breath, denies orthopnea or paroxysmal nocturnal dyspnea.  She denies any recent fevers, chills, cough, nausea, vomiting, diarrhea.  She does admit to not drinking enough water throughout the day.   CURRENT MEDICATIONS:  1. Hydrochlorothiazide 25 mg 1/2 tablet daily.  2. Zetia 10 mg daily.  3. Sinemet 25/100 mg 1/2 in the morning, 1 in the evening.  4. Sinemet 25/100 mg 1 at noon.  5. Diovan 160 mg 1/2 tablet daily.  6. Calcium daily.  7. Atenolol 50 mg b.i.d.  8. Zoloft 50 mg 2 tablets daily.  9. Norvasc 5 mg daily.  10.Co-enzyme Q10.  11.Coumadin as directed.  12.Folic acid.  13.Ambien 5 mg nightly.  14.Aspirin 81 mg daily.  15.Synthroid 0.088mg  daily.  16.Fosamax 70 mg weekly.  17.Albuterol p.r.n.  18.Darvocet p.r.n.  19.Refresh eye drops.   ALLERGIES:  PENICILLIN, TRANDATE, HYTRIN, LOTREL, CODEINE. LEVAQUIN,  NIASPAN.   PHYSICAL EXAMINATION:  She is a well-nourished,  well-developed female in  no distress.  Blood pressure is 145/70,with a pulse of 59 lying, sitting 138/73, pulse  of 62.  Standing 125/64, pulse 65, after 2 minutes 127/67 with a pulse  of 60, after 5 minutes 116/64 with the pulse of 60.  She was  asymptomatic. Weight 146 pounds.  HEENT:  Normal.  NECK:  Without JVD.  CARDIAC:  Normal S1, S2.  Regular rate and rhythm with a 1/6 systolic  ejection murmur heard best in the right upper sternal border.  LUNGS:  Clear to auscultation bilaterally without wheezing, rhonchi or  rales.  ABDOMEN:  Soft, nontender with normoactive bowel sounds, no  organomegaly.  EXTREMITIES:  Without edema.  Calves soft, nontender.  SKIN:  Warm and dry.  NEUROLOGIC:       She is alert and oriented x3.  Cranial nerves II  through XII grossly intact.   IMPRESSION:  1. Orthostatic hypotension.  2. Coronary artery disease status post coronary artery bypass graft  1999.      a.     Grafts included to left internal mammary artery to left       anterior descending, saphenous vein graft to the posterior       descending and posterior lateral and saphenous vein graft to       obtuse marginal.  3. Sick sinus syndrome, status post permanent pacemaker implantation.      a.     Status post recall of device with redo in 2006.  4. History of tachy palpitations.  5. Hyperlipidemia.  6. Hypothyroidism.  7. History of cerebrovascular disease.      a.     Forty to fifty-nine percent bilateral internal carotid       artery stenosis by carotid Dopplers December 2007.      b.     Treated hypothyroidism.  8. History of mild renal artery stenosis with an elevation of      creatinines in the past.   PLAN:  The patient presents to the office today with orthostatic  hypotension.  In looking through her records, this has been an issue for  her in the past and her medications have been adjusted.  She does admit  to not drinking enough water throughout the day.  With the  increased  heat recently, I suspect she probably got dehydrated.  I have  recommended that she go ahead and stop her hydrochlorothiazide  altogether.  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  If her numbers  start to go higher, she knows to call us back.  If we need to, we could  certainly have her go back on hydrochlorothiazide every other day to see  if this would help bring her pressure down to  reasonable level and avoid symptomatic orthostatic hypotension.  We will  go ahead and get a CBC and a BMET today.  I will bring her back in  followup with Dr. Riley Kill in the next 4 weeks.      Tereso Newcomer, PA-C  Electronically Signed      Salvadore Farber, MD  Electronically Signed   SW/MedQ  DD: 05/11/2007  DT: 05/12/2007  Job #: [id number]   cc:   Dr. Alwyn Ren

## 2011-04-09 NOTE — Op Note (Signed)
[name], [name]                ACCOUNT NO.:  [id number]   MEDICAL RECORD NO.:  [id number]          PATIENT TYPE:  INP   LOCATION:  3528                         FACILITY:  [facility]   PHYSICIAN:  Danae Orleans. Venetia Maxon, M.D.  DATE OF BIRTH:  1926/12/09   DATE OF PROCEDURE:  11/10/2008  DATE OF DISCHARGE:                               OPERATIVE REPORT   PREOPERATIVE DIAGNOSIS:  Right L4-5 far-lateral herniated lumbar disk  with right L4 radiculopathy.   POSTOPERATIVE DIAGNOSIS:  Right L4-5 far-lateral herniated lumbar disk  with right L4 radiculopathy.   PROCEDURE:  Right L4-5 far-lateral Metrix microdiscectomy with  microdissection.   SURGEON:  Danae Orleans. Venetia Maxon, MD   ASSISTANT:  Georgiann Cocker, RN and Clydene Fake, MD   ANESTHESIA:  General endotracheal anesthesia.   ESTIMATED BLOOD LOSS:  Minimal.   COMPLICATIONS:  None.   DISPOSITION:  Recovery.   INDICATION:  [name] is a 77 year old woman with severe right L4  radiculopathy with a free-fragment herniated disk at the far-lateral  position compressing the right L4 nerve root.  It was elected to take  her to surgery for Metrix microdiscectomy.   PROCEDURE:  [name] was brought to the operating room.  Following a  satisfactory and uncomplicated induction of general endotracheal  anesthesia and well-healed placement of intravenous lines, the patient  was placed in a prone position on the Wilson frame.  Her low back was  prepped and draped in the usual sterile fashion.  Correct level was  localized with AP C-arm fluoroscopy and small incision was made in the  paramedian position overlying the right L4 pars interarticularis, this  was carried through the fascia.  Using the Metrix sequential dilators,  the 22 mm x 5 cm tubular retractor was placed with under loupe  magnification, the pars was thinned with high-speed drill and the bone  removal was completed with Kerrison rongeur.  Microscope was brought  into the field.   Under microdissection technique, the L4 nerve root was  identified in its extraforaminal position.  Deep to the nerve root,  there was a fragment of herniated disk material, which was directly  beneath the L4 ganglion.  This fragment was removed with significant  decompression of the nerve root.  At this point, it was felt that the  nerve root was well decompressed.  Hemostasis was assured.  The wound  was irrigated.  Ball probes were placed and palpation was performed and  there does not appear to be any residual nerve root compression.  The  operative site was bathed in Depo-Medrol and fentanyl.  The tubular  retractor was removed.  The lumbodorsal fascia was closed with 2-0  Vicryl  sutures.  The skin edges were reapproximated with 3-0 Vicryl interrupted  inverted sutures.  The wound was dressed with Dermabond.  The patient  was extubated in the operating room and taken to recovery room in stable  satisfactory condition having tolerated the operation well.  Counts were  correct at the end of the case.      Danae Orleans. Venetia Maxon, M.D.  Electronically Signed  JDS/MEDQ  D:  11/10/2008  T:  11/11/2008  Job:  [id number]

## 2011-04-09 NOTE — Assessment & Plan Note (Signed)
[facility]                            CARDIOLOGY OFFICE NOTE   KARLISSA, ARON                       MRN:          [id number]  DATE:08/06/2007                            DOB:          [birth year]    Ms. Poplaski is in for a follow-up visit.  In general, she has been doing  pretty well.  She does get some soreness in her left calf.  She says  that when she walks up to two blocks she gets some tightness in the  calf.  More importantly it is painful at night, and if she puts a  stocking on it improves.  There has been some lower extremity edema as  well.   PHYSICAL EXAMINATION:  VITAL SIGNS:  Blood pressure 130/70, pulse 64.  LUNGS:  Lung fields are clear.  HEART:  Rhythm is unchanged and regular.  ABDOMEN:  Soft.  EXTREMITIES:  There are 1+ pulses in the foot on the left and 1/2+  pulses on the right.  There is only trace edema involving the left lower  extremity.   At the present time she is stable given the recent data on Atenolol, we  will replace Atenolol for metoprolol.  Metoprolol will be given b.i.d.  We will resume her Crestor.  We will have her return for a lipid profile  soon.  She will also get a BMET to make sure that her renal function has  remained stable.  Arterial and venous Dopplers will be obtained to rule  out DVT, and also to evaluate for arterial insufficiency given her  history of suggested claudication.  We will continue to follow her  closely.     Arturo Morton. Riley Kill, MD, [facility]  Electronically Signed    TDS/MedQ  DD: 08/06/2007  DT: 08/06/2007  Job #: [id number]   cc:   Titus Dubin. Alwyn Ren, MD,FACP,FCCP

## 2011-04-09 NOTE — Assessment & Plan Note (Signed)
[facility]                            CARDIOLOGY OFFICE NOTE   Colleen Morris, Colleen Morris                       MRN:          [id number]  DATE:06/11/2007                            DOB:          1927/06/12    Colleen Morris is in for followup.  She has developed some bilateral lower  extremity edema.  She has gained about 15 pounds since she was last seen  in the office.  At that time, she had pretty substantial orthostatic  hypotension.  She felt like she was dehydrated at that time and her  diuretic was stopped.  She is now doing somewhat better.  She has,  however, gained the weight, and feels like the swelling in the lower  extremities has increased fairly dramatically over the past few weeks.   Today on examination, the blood pressure is 150/72 lying, by standing it  was 170/70, pulse was 60.  The lung fields actually were fairly clear.  There was 1 to 2+ edema in both lower extremities bilaterally and it was  fairly equal.   The electrocardiogram demonstrates atrioventricular pacing.   Colleen Morris and I have reviewed her situation.  She wants to go back to  hydrochlorothiazide 1/2 tablet daily.  She thinks that this will help,  and she thinks that the problem in the past was solely related to  orthostasis.  With this, she wishes to resume this and so we will see  her back in 6 weeks.  At that time we will reassess her status.  If she  loses an excessive amount of weight, she is to contact us promptly.     Arturo Morton. Riley Kill, MD, [facility]  Electronically Signed    TDS/MedQ  DD: 06/11/2007  DT: 06/12/2007  Job #: [id number]   cc:   Titus Dubin. Alwyn Ren, MD,FACP,FCCP

## 2011-04-12 NOTE — H&P (Signed)
[name], Morris NO.:  [id number]   MEDICAL RECORD NO.:  [id number]          PATIENT TYPE:  OIB   LOCATION:  2899                         FACILITY:  [facility]   PHYSICIAN:  Maple Mirza, P.A. DATE OF BIRTH:  29-Sep-1927   DATE OF ADMISSION:  01/01/2005  DATE OF DISCHARGE:                                HISTORY & PHYSICAL   CARDIOLOGIST:  Arturo Morton. Riley Kill, M.D.   PRIMARY CARE PHYSICIAN:  Dr. Mikey Bussing.   ALLERGIES:  PENICILLIN, TRANDATE, HYTRIN, LOTREL, CODEINE, LEVAQUIN AND  NIASPAN.   CHIEF COMPLAINT:  I am here to have my pacemaker generator changed.[name]   HISTORY OF PRESENT ILLNESS:  [name] is a 77 year old female.  She is  status post pacemaker implant, a Guidant device, June 1999 for tachy-brady  syndrome.  She presented with syncope and was found on catheterization to  have severe three vessel coronary artery disease.  She subsequently had  emergent coronary artery bypass graft surgery times four.  She is now at  elective replacement indicator AND on the recall list for the Guidant  device.  The patient is currently very active.  She works as a Agricultural consultant in  cardiac rehabilitation.  She is not complaining of chest pain, dyspnea.  She  feels vigorous.  She lost 20 pounds in October and November of 2005  secondary to pneumonia which was treated with antibiotics and with home  bedrest.  The patient feels fine and is gaining her weight back.  She has no  history of diabetes, cerebrovascular accident, gastrointestinal bleeding,  deep venous thrombosis, or pulmonary embolism.  The patient says that she is  pacer dependent.  Cardiac risk factors include hypertension and  dyslipidemia.  Cardiac risk equivalents include known (1) coronary artery  disease and (2) extracranial cerebrovascular occlusive disease with left  internal carotid artery stenosis with a work-up pending in February of 2006  by Dr. Madilyn Fireman cardiovascular surgeon of [place].  She has had  coronary  artery bypass in June of 1999.  She had an adenosine Cardiolite one year ago  which was negative for ischemia.  Ejection fraction currently unknown.   CURRENT MEDICATIONS:  1.  Atenolol 50 mg twice daily.  2.  Crestor 10 mg daily at bedtime.  3.  Hydrochlorothiazide 25 mg 1/2 tablet twice daily.  4.  Aspirin 81 mg daily in the evening.  5.  Zoloft 50 mg daily.  6.  Calcium 1000 mg daily.  7.  Synthroid 125 mcg daily.  8.  Albuterol inhaler twice daily and as needed.  9.  Diovan 160 mg daily.  10. Norvasc 5 mg daily in the evening.  11. Coenzyme 10 50 mg every morning.  12. Tricor 160 mg at night.  13. Coumadin 1 mg daily except for Friday 2 mg.   SOCIAL HISTORY:  The patient is a retired Engineer, civil (consulting).  She does not smoke, does  not partake of alcoholic beverages, very active in cardiac rehabilitation.   FAMILY HISTORY:  Both mother and father lived into their 52s.  One brother  deceased who has congestive heart failure.  Three sisters, one has  emphysema, one has osteoporosis, one with Alzheimer's, but all still living  in their 69s.   REVIEW OF SYSTEMS:  The patient has a 20 pound weight loss secondary to  pneumonia for which she was treated in October and November 2005.  The  patient is not complaining of any nasal discharge, epistaxis, hoarseness or  vertigo.  Integument:  There are no rashes or lesions.  Cardiopulmonary:  The patient is not complaining of chest pain, shortness of breath, dyspnea  on exertion, orthopnea, paroxysmal nocturnal dyspnea or lower extremity  edema.  She has no recent history of presyncope or syncope although syncope  was her presenting symptom prior to coronary artery bypass grafting.  She  has claudication symptoms in both legs, no history of palpitation.  Urogenital:  This system has no significant complaints.  Neurologic:  No  weakness.  She does complain of spasms in the right upper extremity when  awakening.  This may or may not have a more  deep seated cause in her  extracranial cerebrovascular occlusive disease.  Work-up is pending.  She  does she has at time difficulty with word finding.  GI:  The patient has a  small hiatal hernia but is not complaining of chronic heartburn.  No history  of gastrointestinal bleeding.  The patient is being treated for  hypothyroidism but is not complaining of polydipsia or cold intolerance.  Musculoskeletal:  No arthralgias or joint swellings.  All other systems are  negative.   PHYSICAL EXAMINATION:  VITAL SIGNS:  Temperature 96.9, pulse 61, blood  pressure 169/72, respirations 18.  GENERAL:  She is alert and oriented times three and very comfortable, very  chatty pleasant patient.  HEENT:  Normocephalic and atraumatic.  Eyes:  Pupils equal, round and  reactive to light.  Extraocular movements are intact. Nares without  discharge.  NECK:  Supple.  Unable to distinguish carotid bruit on the left side.  No  jugular venous distention.  No cervical lymphadenopathy.  HEART:  Regular rate and rhythm currently without murmur.  LUNGS:  Clear to auscultation and percussion, but perhaps some basilar  crackles.  A well-healed midline sternotomy incision as well as well-healed  saphenous vein harvest in the right lower extremity.  EXTREMITIES:  No evidence of cyanosis, clubbing or edema.  MUSCULOSKELETAL:  No joint deformity or effusions.  NEUROLOGIC:  No focal deficits noted.   Electrocardiogram and chest x-ray are pending as well as laboratory studies.   ASSESSMENT:  1.  Admission for generator change on Guidant recall list.  2.  Status post pacemaker implant June of 1999 for tachy-brady syndrome.  3.  Severe three-vessel coronary artery disease, status post emergent      coronary artery bypass grafting surgery times four in June, 1999.  Her      presentation was syncope.  4.  Extracranial cerebrovascular occlusive disease with left internal     carotid artery stenosis under surveillance.   She has some mild symptoms      which may be attributable to this.  Right upper extremity spasms on      awakening at times and word finding difficulties.  5.  Hypertension.  6.  Hypothyroidism.  7.  Bronchospastic lung disease.  8.  Claudication.  9.  Diverticulosis.  10. History of paroxysmal atrial fibrillation although now in sinus rhythm,      Coumadin therapy.  11. Hiatal hernia.  12. Dyslipidemia.  13. Status post coronary artery bypass graft  surgery.  14. Status post pacemaker implant.  15. Bilateral blepharoplasties.  16. Appendectomy.  17. Status post tonsillectomy and adenoidectomy.  18. Bilateral neuroma resections of the feet.   PLAN:  Generator change today by Duke Salvia, M.D.      GM/MEDQ  D:  01/01/2005  T:  01/01/2005  Job:  [id number]

## 2011-04-12 NOTE — Assessment & Plan Note (Signed)
[facility]                              CARDIOLOGY OFFICE NOTE   NAJIA, HURLBUTT                       MRN:          [id number]  DATE:08/28/2006                            DOB:          [birth year]    Karema is in for a followup visit.  To briefly summarize, this very nice lady  has had some T9 and &10 osteoporotic fractures.  She has been getting along  reasonably well except for the back pain associated with this.  She has not  had significant shortness of breath.  She denies any ongoing chest pain.   PHYSICAL EXAMINATION:  The weight is 138 pounds, blood pressure is 164/82  and the pulse is 60.  The lung fields are relatively clear.  The cardiac rhythm is regular and there is a soft systolic ejection murmur.  The pacer site looks good.   Her EKG reveals atrial ventricular pacing.   IMPRESSION:  1. Coronary artery disease, status post coronary artery bypass graft      surgery x4 in 1999, with an internal mammary to the left anterior      descending, saphenous vein graft to the posterior descending and      posterolateral saphenous vein graft to the obtuse marginal.  2. Status post permanent pacer implantation in 1999 with re-do in 2006.  3. History of hyperlipidemia, on lipid-lowering therapy, near target.  4. Hyperhomocysteinemia, on folic acid.  5. Hypothyroidism.  6. History of carotid stenosis, followed by Dr. Pearlean Brownie and Dr. Madilyn Fireman.   PLAN:  1. Return to clinic in 6 months.  2. Discontinue the TriCor.  3. Add Zetia.  4. Get lipid and liver profile in about 6 weeks.  5. Continue antihypertensive therapy.  6. Follow up with Dr. Berton Mount in December for pacer followup.       Arturo Morton. Riley Kill, MD, [facility]    TDS/MedQ  DD:  08/28/2006  DT:  08/30/2006  Job #:  [id number]

## 2011-04-12 NOTE — Op Note (Signed)
NAMEJORDY, HEWINS NO.:  [id number]   MEDICAL RECORD NO.:  [id number]          PATIENT TYPE:  OIB   LOCATION:  2899                         FACILITY:  [facility]   PHYSICIAN:  Duke Salvia, M.D.  DATE OF BIRTH:  March 05, 1927   DATE OF PROCEDURE:  01/01/2005  DATE OF DISCHARGE:                                 OPERATIVE REPORT   PREOPERATIVE DIAGNOSIS:  Sinus node arrest, status post previously-implanted  pacemaker, now end-of-life.   POSTOPERATIVE DIAGNOSIS:  Sinus node arrest, status post previously-  implanted pacemaker, now end-of-life.   PROCEDURE:  Explantation of a dual chamber pacemaker implantation with a new  device.   CARDIOLOGIST:  Duke Salvia, M.D.   DESCRIPTION OF PROCEDURE:  Following the obtaining of an informed consent,  the patient was brought to the electrophysiology laboratory and placed on  the fluoroscopic table in the supine position.  Fluoroscopic imaging was  undertaken to demonstrate the orientation of the lead.  This having been  done, lidocaine was infiltrated in the pre-pectoral subclavicular region  along the line of the previous incision, and was carried down to the layer  of the device pocket using electrocautery and sharp dissection.  The pocket  was opened.  The device was explanted.  The previously-implanted atrial lead  was a Medtronic [id number], serial [id number], and the previously-implanted  ventricular lead was a St. Jude lead [id number], serial [id number].  Through the  previously-implanted device, the bipolar -  it was 13.6, and I apparently  did not check it for atrial pacing after we hooked up the atrial lead, so  the impedance was 920, threshold was 0.6 to 0.5.  There is no intrinsic  atrial rhythm that was known.  The impedance was 659 and threshold was 0.7  to 0.5.  With these acceptable parameters recorded, the leads were then  attached to a Guidant Insignia Ultra model 1291 pulse generator, serial  [id number].  Atrial  pacing was identified.  The pocket was copiously irrigated  with antibiotic-containing saline solution.  Hemostasis was assured in the  leads, and the pulse generator was placed in the pocket and secured to the  pre-pectoral fascia.  The wound was closed in three layers in the normal  fashion.  The wound was washed out and a Benzoin and Steri-Strip dressing  was applied.  Because the patient's INR was 2.5, pressure was held for four  to five minutes following the end of the procedure.   The patient tolerated the procedure without apparent complication.      SCK/MEDQ  D:  01/01/2005  T:  01/01/2005  Job:  [id number]

## 2011-04-12 NOTE — Assessment & Plan Note (Signed)
[facility]                         ELECTROPHYSIOLOGY OFFICE NOTE   EMAYA, PRESTON                       MRN:          [id number]  DATE:01/01/2007                            DOB:          10-08-1927    [name] was seen today in the clinic on January 01, 2007, for  followup of her Guidant model number 1291 [place].  Date of implant was  January 01, 2005, for sinus node arrest.  On interrogation of her device  today, her battery voltage is good, 100%.  P waves were not measured.  She is dependent in the atrium with an atrial capture threshold of 1.2  volts at 0.4 milliseconds and an atrial lead impedance of 740 ohms.  R  waves were 11.1 millivolts with a ventricular pacing threshold of 0.5  volts at 0.4 milliseconds and a ventricle lead impedance of 770 ohms.  No changes were made in her parameters today.  She will continue with  her monthly telephone checks through [employer] and a return office visit in  1 year's time.      Altha Harm, LPN  Electronically Signed      Duke Salvia, MD, [facility]  Electronically Signed   PO/MedQ  DD: 01/01/2007  DT: 01/01/2007  Job #: [id number]

## 2011-04-12 NOTE — Assessment & Plan Note (Signed)
[facility]                            CARDIOLOGY OFFICE NOTE   Colleen Morris, Colleen Morris                       MRN:          [id number]  DATE:03/05/2007                            DOB:          05-06-1927    Colleen Morris is in today for a followup visit.  She is stable.  She has  not been having any ongoing chest pain.  Dr. Alwyn Ren has been adjusting  her medicines, and actually decreasing her hydrochlorothiazide.  She had  a lipid profile done today, and her LDL is back up to 168.  She stopped  her Crestor because of loss of hair.  She did not have this on  simvastatin before, but also did not have as good cholesterol control.   Her current medications include:  1. Hydrochlorothiazide 25 mg 1/2 tablet daily.  2. Zetia 10 mg daily.  3. Atenolol 50 mg p.o. b.i.d.  4. Calcium daily.  5. Zoloft 50 mg 2 tablets daily.  6. Diovan 160 mg daily.  7. Norvasc 5 mg daily.  8. Coenzyme Q-10 100 mg daily.  9. Tricor 145 mg daily.  10.Coumadin as directed.  11.Folic acid 400 mcg daily.  12.Ambien 5 mg nightly.  13.Sinemet 24/100 1/2 t.i.d.  14.Aspirin 81 mg daily.  15.Synthroid 0.88 mcg daily.  16.Fosamax weekly.  17.Albuterol inhaler.   PHYSICAL EXAMINATION:  She is alert, oriented, and in no acute distress.  Blood pressure is 139/66.  Pulse 63.  LUNG FIELDS:  Quite clear.  The median sternotomy is well healed.   Electrocardiogram demonstrates atrial ventricular pacing.   IMPRESSION:  1. Coronary artery disease, status post coronary artery bypass graft      surgery x4 with an internal mammary to the left anterior      descending, saphenous vein graft to the posterior descending, and      posterolateral saphenous vein graft to the obtuse marginal.  2. Status post permanent pacer implantation in 1999 with redo in 2006.  3. History of hyperlipidemia, on lipid lowering therapy.  4. Hyperhomocysteinemia.  5. Hypothyroidism, on therapy.  6. History of carotid  stenosis followed by Dr. Riley Kill and Dr. Madilyn Fireman.  7. History of mild renal artery stenosis with some elevation in      creatinines.   PLAN:  1. The patient has discontinued Crestor.  2. Zocor will be started, or simvastatin at 40 mg nightly.  3. Continue Zetia.  4. Get lipid and liver profile in about 6 weeks.  5. Continue antihypertensive therapy.  6. Continue followup in pacemaker clinic.  7. Continue folic acid.     Arturo Morton. Riley Kill, MD, [facility]  Electronically Signed    TDS/MedQ  DD: 03/05/2007  DT: 03/05/2007  Job #: [id number]

## 2011-04-17 ENCOUNTER — Ambulatory Visit (INDEPENDENT_AMBULATORY_CARE_PROVIDER_SITE_OTHER): Payer: Medicare Other | Admitting: *Deleted

## 2011-04-17 DIAGNOSIS — I4891 Unspecified atrial fibrillation: Secondary | ICD-10-CM

## 2011-04-17 LAB — POCT INR: INR: 2.7

## 2011-05-01 ENCOUNTER — Encounter: Payer: Self-pay | Admitting: Internal Medicine

## 2011-05-01 DIAGNOSIS — I495 Sick sinus syndrome: Secondary | ICD-10-CM

## 2011-05-03 ENCOUNTER — Ambulatory Visit (INDEPENDENT_AMBULATORY_CARE_PROVIDER_SITE_OTHER): Payer: Medicare Other | Admitting: Family Medicine

## 2011-05-03 ENCOUNTER — Encounter: Payer: Self-pay | Admitting: Family Medicine

## 2011-05-03 ENCOUNTER — Other Ambulatory Visit: Payer: Self-pay | Admitting: Internal Medicine

## 2011-05-03 ENCOUNTER — Telehealth: Payer: Self-pay | Admitting: *Deleted

## 2011-05-03 ENCOUNTER — Other Ambulatory Visit: Payer: Self-pay | Admitting: Cardiology

## 2011-05-03 VITALS — BP 132/78 | HR 66 | Temp 98.4°F | Wt 140.8 lb

## 2011-05-03 DIAGNOSIS — Z0000 Encounter for general adult medical examination without abnormal findings: Secondary | ICD-10-CM

## 2011-05-03 DIAGNOSIS — E039 Hypothyroidism, unspecified: Secondary | ICD-10-CM

## 2011-05-03 LAB — TSH: TSH: 5.518 u[IU]/mL — ABNORMAL HIGH (ref 0.350–4.500)

## 2011-05-03 LAB — T4, FREE: Free T4: 0.88 ng/dL (ref 0.80–1.80)

## 2011-05-03 MED ORDER — LEVOTHYROXINE SODIUM 88 MCG PO TABS: 88.0000 ug | ORAL_TABLET | Freq: Every day | ORAL | Status: DC

## 2011-05-03 MED ORDER — ZOSTER VACCINE LIVE 19400 UNT/0.65ML ~~LOC~~ SOLR: 0.6500 mL | Freq: Once | SUBCUTANEOUS | Status: DC

## 2011-05-03 NOTE — Telephone Encounter (Signed)
Pt called back to report you also Rx her zoloft but she does not need any at this time and she has refills on file.

## 2011-05-03 NOTE — Patient Instructions (Signed)
Hypothyroidism The thyroid is a large gland located in the lower front of your neck. The thyroid gland helps control metabolism. Metabolism is how your body handles food. It controls metabolism with the hormone thyroxine. When this gland is underactive (hypothyroid), it produces too little hormone.  SYMPTOMS OF HYPOTHYROIDISM  Lethargy (feeling as though you have no energy)   Cold intolerance   Weight gain (in spite of normal food intake)   Dry skin   Coarse hair   Menstrual irregularity (if severe, may lead to infertility)   Slowing of thought processes  Cardiac problems are also caused by insufficient amounts of thyroid hormone. Hypothyroidism in the newborn is cretinism, and is an extreme form. It is important that this form be treated adequately and immediately or it will lead rapidly to retarded physical and mental development. CAUSES OF HYPOTHYROIDISM These include:   Absence or destruction of thyroid tissue.  Goiter due to iodine deficiency.   Goiter due to medications.   Congenital defects (since birth).  Problems with the pituitary. This causes a lack of TSH (thyroid stimulating hormone). This hormone tells the thyroid to turn out more hormone.   DIAGNOSIS To prove hypothyroidism, your caregiver may do blood tests and ultrasound tests. Sometimes the signs are hidden. It may be necessary for your caregiver to watch this illness with blood tests either before or after diagnosis and treatment. TREATMENT  Low levels of thyroid hormone are increased by using synthetic thyroid hormone. This is a safe, effective treatment. It usually takes about four weeks to gain the full effects of the medication. After you have the full effect of the medication, it will generally take another four weeks for problems to leave. Your caregiver may start you on low doses. If you have had heart problems the dose may be gradually increased. It is generally not an emergency to get rapidly to  normal. HOME CARE INSTRUCTIONS  Take your medications as your caregiver suggests. Let your caregiver know of any medications you are taking or start taking. Your caregiver will help you with dosage schedules.   As your condition improves, your dosage needs may increase. It will be necessary to have continuing blood tests as suggested by your caregiver.   Report all suspected medication side effects to your caregiver.  SEEK MEDICAL CARE IF YOU DEVELOP:  Sweating.  Tremulousness (tremors).   Anxiety.   Rapid weight loss.   Heat intolerance.  Emotional swings.   Diarrhea.   Weakness.   SEEK IMMEDIATE MEDICAL CARE IF: You develop chest pain, an irregular heart beat (palpitations), or a rapid heart beat. MAKE SURE YOU:   Understand these instructions.   Will watch your condition.   Will get help right away if you are not doing well or get worse.  Document Released: 11/11/2005 Document Re-Released: 10/24/2008 [employer] Patient Information 2011 [employer]. 

## 2011-05-03 NOTE — Progress Notes (Signed)
  Subjective:    Patient ID: Colleen Morris, female    DOB: 1927-09-30, 77 y.o.   MRN: [id number]  HPI  Pt here for thyroid re check.   No other complaints.  Review of Systems    as above Objective:   Physical Exam  Constitutional: She is oriented to person, place, and time. She appears well-developed and well-nourished.  Neck: Normal range of motion. Neck supple. No thyromegaly present.  Cardiovascular: Normal rate and regular rhythm.   Pulmonary/Chest: Effort normal and breath sounds normal.  Musculoskeletal: She exhibits edema.  Neurological: She is alert and oriented to person, place, and time.  Psychiatric: She has a normal mood and affect. Her behavior is normal. Judgment and thought content normal.          Assessment & Plan:

## 2011-05-03 NOTE — Assessment & Plan Note (Signed)
Check labs con't meds 

## 2011-05-13 ENCOUNTER — Encounter: Payer: Self-pay | Admitting: Family Medicine

## 2011-05-13 ENCOUNTER — Ambulatory Visit (INDEPENDENT_AMBULATORY_CARE_PROVIDER_SITE_OTHER): Payer: Medicare Other | Admitting: Family Medicine

## 2011-05-13 VITALS — BP 148/72 | HR 69 | Temp 97.3°F | Wt 139.8 lb

## 2011-05-13 DIAGNOSIS — E039 Hypothyroidism, unspecified: Secondary | ICD-10-CM

## 2011-05-13 MED ORDER — LEVOTHYROXINE SODIUM 100 MCG PO TABS: 100.0000 ug | ORAL_TABLET | Freq: Every day | ORAL | Status: DC

## 2011-05-13 NOTE — Assessment & Plan Note (Signed)
Increase synthroid to 100 mcg qd  Recheck 2 months

## 2011-05-13 NOTE — Patient Instructions (Signed)
Hypothyroidism The thyroid is a large gland located in the lower front of your neck. The thyroid gland helps control metabolism. Metabolism is how your body handles food. It controls metabolism with the hormone thyroxine. When this gland is underactive (hypothyroid), it produces too little hormone.  SYMPTOMS OF HYPOTHYROIDISM  Lethargy (feeling as though you have no energy)   Cold intolerance   Weight gain (in spite of normal food intake)   Dry skin   Coarse hair   Menstrual irregularity (if severe, may lead to infertility)   Slowing of thought processes  Cardiac problems are also caused by insufficient amounts of thyroid hormone. Hypothyroidism in the newborn is cretinism, and is an extreme form. It is important that this form be treated adequately and immediately or it will lead rapidly to retarded physical and mental development. CAUSES OF HYPOTHYROIDISM These include:   Absence or destruction of thyroid tissue.  Goiter due to iodine deficiency.   Goiter due to medications.   Congenital defects (since birth).  Problems with the pituitary. This causes a lack of TSH (thyroid stimulating hormone). This hormone tells the thyroid to turn out more hormone.   DIAGNOSIS To prove hypothyroidism, your caregiver may do blood tests and ultrasound tests. Sometimes the signs are hidden. It may be necessary for your caregiver to watch this illness with blood tests either before or after diagnosis and treatment. TREATMENT  Low levels of thyroid hormone are increased by using synthetic thyroid hormone. This is a safe, effective treatment. It usually takes about four weeks to gain the full effects of the medication. After you have the full effect of the medication, it will generally take another four weeks for problems to leave. Your caregiver may start you on low doses. If you have had heart problems the dose may be gradually increased. It is generally not an emergency to get rapidly to  normal. HOME CARE INSTRUCTIONS  Take your medications as your caregiver suggests. Let your caregiver know of any medications you are taking or start taking. Your caregiver will help you with dosage schedules.   As your condition improves, your dosage needs may increase. It will be necessary to have continuing blood tests as suggested by your caregiver.   Report all suspected medication side effects to your caregiver.  SEEK MEDICAL CARE IF YOU DEVELOP:  Sweating.  Tremulousness (tremors).   Anxiety.   Rapid weight loss.   Heat intolerance.  Emotional swings.   Diarrhea.   Weakness.   SEEK IMMEDIATE MEDICAL CARE IF: You develop chest pain, an irregular heart beat (palpitations), or a rapid heart beat. MAKE SURE YOU:   Understand these instructions.   Will watch your condition.   Will get help right away if you are not doing well or get worse.  Document Released: 11/11/2005 Document Re-Released: 10/24/2008 [employer] Patient Information 2011 [employer]. 

## 2011-05-13 NOTE — Progress Notes (Signed)
  Subjective:    Patient ID: Colleen Morris, female    DOB: 1927/01/02, 77 y.o.   MRN: [id number]  HPI Pt here to review labs only.  Review of Systems As above    Objective:   Physical Exam  Constitutional: She appears well-developed and well-nourished.  Psychiatric: She has a normal mood and affect. Her behavior is normal. Judgment and thought content normal.          Assessment & Plan:

## 2011-05-15 ENCOUNTER — Ambulatory Visit (INDEPENDENT_AMBULATORY_CARE_PROVIDER_SITE_OTHER): Payer: Medicare Other | Admitting: *Deleted

## 2011-05-15 DIAGNOSIS — I4891 Unspecified atrial fibrillation: Secondary | ICD-10-CM

## 2011-05-15 LAB — POCT INR: INR: 3.8

## 2011-05-21 ENCOUNTER — Other Ambulatory Visit: Payer: Self-pay | Admitting: Internal Medicine

## 2011-06-04 ENCOUNTER — Ambulatory Visit (INDEPENDENT_AMBULATORY_CARE_PROVIDER_SITE_OTHER): Payer: Medicare Other | Admitting: *Deleted

## 2011-06-04 DIAGNOSIS — I4891 Unspecified atrial fibrillation: Secondary | ICD-10-CM

## 2011-06-04 LAB — POCT INR: INR: 3.7

## 2011-06-05 ENCOUNTER — Encounter: Payer: Self-pay | Admitting: Internal Medicine

## 2011-06-18 ENCOUNTER — Ambulatory Visit (INDEPENDENT_AMBULATORY_CARE_PROVIDER_SITE_OTHER): Payer: Medicare Other | Admitting: *Deleted

## 2011-06-18 DIAGNOSIS — I4891 Unspecified atrial fibrillation: Secondary | ICD-10-CM

## 2011-07-09 ENCOUNTER — Other Ambulatory Visit: Payer: Self-pay | Admitting: Cardiology

## 2011-07-09 ENCOUNTER — Other Ambulatory Visit: Payer: Self-pay | Admitting: Pharmacist

## 2011-07-09 MED ORDER — WARFARIN SODIUM 2 MG PO TABS: 2.0000 mg | ORAL_TABLET | ORAL | Status: DC

## 2011-07-11 ENCOUNTER — Encounter: Payer: Self-pay | Admitting: Internal Medicine

## 2011-07-11 ENCOUNTER — Ambulatory Visit (INDEPENDENT_AMBULATORY_CARE_PROVIDER_SITE_OTHER): Payer: Medicare Other | Admitting: Internal Medicine

## 2011-07-11 ENCOUNTER — Ambulatory Visit (INDEPENDENT_AMBULATORY_CARE_PROVIDER_SITE_OTHER): Payer: Medicare Other | Admitting: *Deleted

## 2011-07-11 DIAGNOSIS — I4891 Unspecified atrial fibrillation: Secondary | ICD-10-CM

## 2011-07-11 DIAGNOSIS — I951 Orthostatic hypotension: Secondary | ICD-10-CM

## 2011-07-11 DIAGNOSIS — I442 Atrioventricular block, complete: Secondary | ICD-10-CM

## 2011-07-11 DIAGNOSIS — Z950 Presence of cardiac pacemaker: Secondary | ICD-10-CM | POA: Insufficient documentation

## 2011-07-11 DIAGNOSIS — I2510 Atherosclerotic heart disease of native coronary artery without angina pectoris: Secondary | ICD-10-CM

## 2011-07-11 NOTE — Patient Instructions (Signed)
Your physician wants you to follow-up in: 1 year with Dr. Graciela Husbands. You will receive a reminder letter in the mail two months in advance. If you don't receive a letter, please call our office to schedule the follow-up appointment.  Keep your scheduled followup appointment with Dr. Riley Kill on Wednesday 8/29 at 3:30pm.  Your physician recommends that you continue on your current medications as directed. Please refer to the Current Medication list given to you today.

## 2011-07-11 NOTE — Assessment & Plan Note (Signed)
Will have her dtake her amlodipine at night as well as her divoan

## 2011-07-11 NOTE — Assessment & Plan Note (Signed)
As above.

## 2011-07-11 NOTE — Progress Notes (Signed)
HPI  Colleen Morris is a 77 y.o. female  seen in followup for pacemaker generator implantation for sinus dysfunction with subsequent development of antegrade complete heart block.   She has orhtostatic lightheadedness which has been ameliorated by taking her diovan at night;    She also has coronary artery disease with prior bypass grafting; echo in January 2011 demonstrated normal left ventricular function.      Past Medical History  Diagnosis Date  . CAD (coronary artery disease)   . Atrial fibrillation   . Hypertension   . Hyperlipidemia   . Depression   . Anxiety   . AV block   . Parkinson's disease   . Hypothyroid   . Orthostatic hypotension   . Renal insufficiency   . Sinoatrial node dysfunction   . Dizziness   . Hypercholesteremia   . Cough   . Sinusitis   . Parkinson disease   . Low back pain syndrome   . Femoral bruit   . Sleep apnea   . Diverticulitis, colon   . Anemia     Past Surgical History  Procedure Date  . Pacemaker insertion   . Coronary artery bypass graft   . Cataract extraction   . Polypectomy   . Right l4-5 far-lateral metrix microdiscectomy     Current Outpatient Prescriptions  Medication Sig Dispense Refill  . amLODipine (NORVASC) 5 MG tablet Take 5 mg by mouth daily.        [name] aspirin 81 MG tablet Take 81 mg by mouth daily.        . carbidopa-levodopa (SINEMET) 25-100 MG per tablet Take 1 tablet by mouth. TAKE 1 WHOLE TABLET 3 TIMES DAILY AND 1/2 (HALF) TABLET QHS      . DIOVAN 320 MG tablet take 1/2 tablet by mouth once daily  30 tablet  6  . folic acid (FOLVITE) 400 MCG tablet Take 400 mcg by mouth daily.        [name] levothyroxine (SYNTHROID) 100 MCG tablet Take 1 tablet (100 mcg total) by mouth daily.  30 tablet  11  . nitroGLYCERIN (NITROSTAT) 0.4 MG SL tablet Place 0.4 mg under the tongue. 1 tablet under tongue at onset of chest pain may repeat in 5 min for up to 3 doses.       . Potassium Gluconate 595 MG TBCR Take by mouth daily.         . propranolol (INDERAL LA) 80 MG 24 hr capsule take 1 capsule by mouth once daily  30 capsule  11  . sertraline (ZOLOFT) 100 MG tablet Take 100 mg by mouth daily.        . valsartan (DIOVAN) 320 MG tablet Take 320 mg by mouth daily.        [name] warfarin (COUMADIN) 2 MG tablet Take 1 tablet (2 mg total) by mouth as directed.  100 tablet  1  . zoster vaccine live, PF, (ZOSTAVAX) 78295 UNT/0.65ML injection Inject 19,400 Units into the skin once.  1 vial  0    Allergies  Allergen Reactions  . Amlodipine Besy-Benazepril Hcl   . Amoxicillin     REACTION: unspecified  . Cephalexin   . Codeine   . Codeine Phosphate     REACTION: unspecified  . Labetalol Hcl   . Levofloxacin   . Niacin     REACTION: unspecified  . Penicillins   . Terazosin Hcl     Review of Systems negative except from HPI and PMH  Physical Exam Well  developed and well nourished in no acute distress HENT normal E scleral and icterus clear Neck Supple JVP flat; carotids brisk and full Clear to ausculation Regular rate and rhythm s4 2/6 sys murmur Soft with active bowel sounds No clubbing cyanosis and edema Alert and oriented, grossly normal motor and sensory function Skin Warm and Dry    Assessment and  Plan

## 2011-07-11 NOTE — Assessment & Plan Note (Signed)
10% AF by monitor,w ith controlled VR 2/2 CHB;  Will stop ASA based on sportif data

## 2011-07-11 NOTE — Assessment & Plan Note (Signed)
The patient's device was interrogated and the information was fully reviewed.  The device was reprogrammed to augment rate response

## 2011-07-12 ENCOUNTER — Other Ambulatory Visit: Payer: Self-pay | Admitting: Family Medicine

## 2011-07-12 DIAGNOSIS — E039 Hypothyroidism, unspecified: Secondary | ICD-10-CM

## 2011-07-15 ENCOUNTER — Other Ambulatory Visit: Payer: Self-pay | Admitting: Family Medicine

## 2011-07-15 ENCOUNTER — Other Ambulatory Visit: Payer: Medicare Other

## 2011-07-15 NOTE — Progress Notes (Signed)
Labs only

## 2011-07-16 ENCOUNTER — Encounter: Payer: Medicare Other | Admitting: *Deleted

## 2011-07-16 ENCOUNTER — Ambulatory Visit (INDEPENDENT_AMBULATORY_CARE_PROVIDER_SITE_OTHER): Payer: Medicare Other | Admitting: Family Medicine

## 2011-07-16 ENCOUNTER — Other Ambulatory Visit (INDEPENDENT_AMBULATORY_CARE_PROVIDER_SITE_OTHER): Payer: Medicare Other

## 2011-07-16 ENCOUNTER — Encounter: Payer: Self-pay | Admitting: Family Medicine

## 2011-07-16 VITALS — BP 134/70 | HR 60 | Temp 97.1°F | Wt 139.0 lb

## 2011-07-16 DIAGNOSIS — E039 Hypothyroidism, unspecified: Secondary | ICD-10-CM

## 2011-07-16 DIAGNOSIS — R55 Syncope and collapse: Secondary | ICD-10-CM

## 2011-07-16 DIAGNOSIS — I10 Essential (primary) hypertension: Secondary | ICD-10-CM

## 2011-07-16 MED ORDER — VALSARTAN 80 MG PO TABS: 80.0000 mg | ORAL_TABLET | Freq: Every day | ORAL | Status: DC

## 2011-07-16 NOTE — Progress Notes (Signed)
Labs only

## 2011-07-16 NOTE — Progress Notes (Signed)
  Subjective:    Colleen Morris is a 77 y.o. female who presents with right shoulder pain. The symptoms began 3 days ago. Aggravating factors: pt fell in bathroom and possible LOC for few seconds.  She hit her shoulder on cabinet.. Pain is located around the acromioclavicular [facility]) joint. Discomfort is described as aching. Symptoms are exacerbated by lying on the shoulder. Evaluation to date: none. Therapy to date includes: nothing specific.  Pt states she got up to go to bathroom and she felt a little dizzy and the next thing she remembers is being on the floor.  No head injury.  Her friend was with her and when she got to her which only took 2 sec she was alert and able to tell her what happened.    The following portions of the patient's history were reviewed and updated as appropriate: allergies, current medications, past family history, past medical history, past social history, past surgical history and problem list.  Review of Systems Pertinent items are noted in HPI.   Objective:    BP 132/70  Pulse 80  Temp(Src) 97.1 F (36.2 C) (Oral)  Wt 139 lb (63.05 kg)  SpO2 96% Right shoulder: positive for tenderness about the glenohumeral joint, full ROM, sensory exam normal and motor exam normal  Left shoulder: normal active ROM, no tenderness, no impingement sign     Neuro--CN 2-12 intact                 No increase in sensory motor deficits--pt has LUE weakness but no change from previous.                    AAOx3  nad ,  PERLA,  EOMI Assessment:  1.  Right shoulder pain   2. ? Syncope--pt states she felt much better after pacemaker adjusted.  She just got a little dizzy when she got up to go to BR and then fell.  D/w Dr Graciela Husbands-- they had tried moving one of her meds to pm but pt states she didn't change it to pm.  We decreased diovan to 80 mg and then her bp will be checked again next week with cardio.  Plan:    Gentle ROM exercises. Rest, ice, compression, and elevation (RICE)  therapy. Plain film x-rays. Follow up in 2 weeks. f/u cardiology  Pt wants to wait on xrays

## 2011-07-17 LAB — TSH: TSH: 0.39 u[IU]/mL (ref 0.35–5.50)

## 2011-07-24 ENCOUNTER — Encounter: Payer: Self-pay | Admitting: Cardiology

## 2011-07-24 ENCOUNTER — Ambulatory Visit (INDEPENDENT_AMBULATORY_CARE_PROVIDER_SITE_OTHER): Payer: Medicare Other | Admitting: Cardiology

## 2011-07-24 DIAGNOSIS — I10 Essential (primary) hypertension: Secondary | ICD-10-CM

## 2011-07-24 DIAGNOSIS — E785 Hyperlipidemia, unspecified: Secondary | ICD-10-CM

## 2011-07-24 DIAGNOSIS — I359 Nonrheumatic aortic valve disorder, unspecified: Secondary | ICD-10-CM

## 2011-07-24 DIAGNOSIS — I4891 Unspecified atrial fibrillation: Secondary | ICD-10-CM

## 2011-07-24 DIAGNOSIS — I442 Atrioventricular block, complete: Secondary | ICD-10-CM

## 2011-07-24 DIAGNOSIS — Z79899 Other long term (current) drug therapy: Secondary | ICD-10-CM

## 2011-07-24 DIAGNOSIS — I350 Nonrheumatic aortic (valve) stenosis: Secondary | ICD-10-CM

## 2011-07-24 DIAGNOSIS — M25442 Effusion, left hand: Secondary | ICD-10-CM

## 2011-07-24 NOTE — Patient Instructions (Signed)
Your physician recommends that you schedule a follow-up appointment in:  4 MONTHS WITH DR Riley Kill  Your physician recommends that you continue on your current medications as directed. Please refer to the Current Medication list given to you today.  Your physician recommends that you return for lab work in: BMET SAME DAY AS ECHO  DX V58.69  Your physician has requested that you have an echocardiogram. Echocardiography is a painless test that uses sound waves to create images of your heart. It provides your doctor with information about the size and shape of your heart and how well your heart's chambers and valves are working. This procedure takes approximately one hour. There are no restrictions for this procedure. DX AORTIC STENOSIS  PT'S CONVENIENCE

## 2011-07-25 NOTE — Progress Notes (Signed)
HPI:  She has had some problems with her left hand.  She has developed a contraction, and the etiology of this is unclear.  There is some swelling.  She also got a bit light headed, particularly while sitting on the commode, and had to sit down on the floor.  She had an aura of potentially passing out at this point but did not.   Current Outpatient Prescriptions  Medication Sig Dispense Refill  . amLODipine (NORVASC) 5 MG tablet Take 5 mg by mouth daily.        . carbidopa-levodopa (SINEMET) 25-100 MG per tablet Take 1 tablet by mouth. TAKE 1 WHOLE TABLET 3 TIMES DAILY AND 1/2 (HALF) TABLET QHS      . folic acid (FOLVITE) 400 MCG tablet Take 400 mcg by mouth daily.        [name] levothyroxine (SYNTHROID) 100 MCG tablet Take 1 tablet (100 mcg total) by mouth daily.  30 tablet  11  . nitroGLYCERIN (NITROSTAT) 0.4 MG SL tablet Place 0.4 mg under the tongue. 1 tablet under tongue at onset of chest pain may repeat in 5 min for up to 3 doses.       . Potassium Gluconate 595 MG TBCR Take by mouth daily.        . propranolol (INDERAL LA) 80 MG 24 hr capsule take 1 capsule by mouth once daily  30 capsule  11  . sertraline (ZOLOFT) 100 MG tablet Take 100 mg by mouth daily.        . valsartan (DIOVAN) 80 MG tablet Take 1 tablet (80 mg total) by mouth daily.  30 tablet  2  . warfarin (COUMADIN) 2 MG tablet Take 1 tablet (2 mg total) by mouth as directed.  100 tablet  1  . zoster vaccine live, PF, (ZOSTAVAX) 78295 UNT/0.65ML injection Inject 19,400 Units into the skin once.  1 vial  0    Allergies  Allergen Reactions  . Amlodipine Besy-Benazepril Hcl   . Amoxicillin     REACTION: unspecified  . Cephalexin   . Codeine   . Codeine Phosphate     REACTION: unspecified  . Keflex   . Labetalol Hcl   . Levofloxacin   . Niacin     REACTION: unspecified  . Penicillins   . Terazosin Hcl     Past Medical History  Diagnosis Date  . CAD (coronary artery disease)   . Atrial fibrillation   . Hypertension   .  Hyperlipidemia   . Depression   . Anxiety   . AV block   . Parkinson's disease   . Hypothyroid   . Orthostatic hypotension   . Renal insufficiency   . Sinoatrial node dysfunction   . Dizziness   . Hypercholesteremia   . Cough   . Sinusitis   . Parkinson disease   . Low back pain syndrome   . Femoral bruit   . Sleep apnea   . Diverticulitis, colon   . Anemia     Past Surgical History  Procedure Date  . Pacemaker insertion   . Coronary artery bypass graft   . Cataract extraction   . Polypectomy   . Right l4-5 far-lateral metrix microdiscectomy     Family History  Problem Relation Age of Onset  . Heart disease Sister   . Heart failure Brother     CHF  . Emphysema Sister   . Osteoporosis Sister   . Alzheimer's disease Sister     History   Social  History  . Marital Status: Widowed    Spouse Name: N/A    Number of Children: N/A  . Years of Education: N/A   Occupational History  . retired Engineer, civil (consulting)    Social History Main Topics  . Smoking status: Never Smoker   . Smokeless tobacco: Never Used  . Alcohol Use: No  . Drug Use: No  . Sexually Active: Not on file   Other Topics Concern  . Not on file   Social History Narrative  . No narrative on file    ROS: Please see the HPI.  All other systems reviewed and negative.  PHYSICAL EXAM:  BP 110/70  Pulse 61  Ht 5\' 2"  (1.575 m)  Wt 144 lb (65.318 kg)  BMI 26.34 kg/m2  General: Elderly chronically ill appearing at the present time.   Head:  Normocephalic and atraumatic. Neck: no JVD Lungs: Clear to auscultation and percussion. Heart: SEM  2-3/6.  No DM.   Abdomen:  Normal bowel sounds; soft; non tender; no organomegaly Pulses: Pulses normal in all 4 extremities. Extremities: Some slight swelling and contraction of the left hand.   Neurologic: Alert and oriented x 3.  EKG:  Probable atrioventricular pacing versus a tracking v pacing.   ASSESSMENT AND PLAN:

## 2011-07-26 ENCOUNTER — Other Ambulatory Visit: Payer: Self-pay

## 2011-07-26 ENCOUNTER — Telehealth: Payer: Self-pay

## 2011-07-26 DIAGNOSIS — R296 Repeated falls: Secondary | ICD-10-CM

## 2011-07-26 MED ORDER — HUGO ROLLING WALKER ELITE MISC: 1.0000 | Status: DC | PRN

## 2011-07-26 MED ORDER — NONFORMULARY OR COMPOUNDED ITEM: Status: DC

## 2011-07-26 NOTE — Telephone Encounter (Signed)
Msgs from patient's daughter stating she wanted to get a Walker and Rails for the Commode since the patient is starting to have recurrent falls. Patient would like an RX Please advise     KP

## 2011-07-26 NOTE — Telephone Encounter (Signed)
Spoke with Coralee North and she declined home health.... Rx printed and left at check In   KP

## 2011-07-26 NOTE — Telephone Encounter (Signed)
We can do that or get home health to assess all her needs and get it all taken care of at once.

## 2011-07-31 ENCOUNTER — Other Ambulatory Visit (HOSPITAL_COMMUNITY): Payer: Self-pay | Admitting: Internal Medicine

## 2011-07-31 DIAGNOSIS — M79642 Pain in left hand: Secondary | ICD-10-CM

## 2011-08-01 ENCOUNTER — Ambulatory Visit (HOSPITAL_COMMUNITY): Payer: Medicare Other | Attending: Cardiology | Admitting: Radiology

## 2011-08-01 ENCOUNTER — Other Ambulatory Visit (INDEPENDENT_AMBULATORY_CARE_PROVIDER_SITE_OTHER): Payer: Medicare Other | Admitting: *Deleted

## 2011-08-01 DIAGNOSIS — E039 Hypothyroidism, unspecified: Secondary | ICD-10-CM

## 2011-08-01 DIAGNOSIS — I350 Nonrheumatic aortic (valve) stenosis: Secondary | ICD-10-CM

## 2011-08-01 DIAGNOSIS — I359 Nonrheumatic aortic valve disorder, unspecified: Secondary | ICD-10-CM

## 2011-08-01 DIAGNOSIS — I4891 Unspecified atrial fibrillation: Secondary | ICD-10-CM | POA: Insufficient documentation

## 2011-08-01 DIAGNOSIS — I2510 Atherosclerotic heart disease of native coronary artery without angina pectoris: Secondary | ICD-10-CM | POA: Insufficient documentation

## 2011-08-01 DIAGNOSIS — M79642 Pain in left hand: Secondary | ICD-10-CM

## 2011-08-01 DIAGNOSIS — G20 Parkinson's disease: Secondary | ICD-10-CM | POA: Insufficient documentation

## 2011-08-01 DIAGNOSIS — I10 Essential (primary) hypertension: Secondary | ICD-10-CM | POA: Insufficient documentation

## 2011-08-01 DIAGNOSIS — I379 Nonrheumatic pulmonary valve disorder, unspecified: Secondary | ICD-10-CM | POA: Insufficient documentation

## 2011-08-01 DIAGNOSIS — E7800 Pure hypercholesterolemia, unspecified: Secondary | ICD-10-CM | POA: Insufficient documentation

## 2011-08-01 DIAGNOSIS — G20A1 Parkinson's disease without dyskinesia, without mention of fluctuations: Secondary | ICD-10-CM | POA: Insufficient documentation

## 2011-08-01 DIAGNOSIS — I079 Rheumatic tricuspid valve disease, unspecified: Secondary | ICD-10-CM | POA: Insufficient documentation

## 2011-08-01 DIAGNOSIS — I080 Rheumatic disorders of both mitral and aortic valves: Secondary | ICD-10-CM | POA: Insufficient documentation

## 2011-08-02 LAB — BASIC METABOLIC PANEL
Chloride: 99 mEq/L (ref 96–112)
Potassium: 4.8 mEq/L (ref 3.5–5.1)

## 2011-08-02 LAB — TSH: TSH: 0.33 u[IU]/mL — ABNORMAL LOW (ref 0.35–5.50)

## 2011-08-07 ENCOUNTER — Telehealth: Payer: Self-pay

## 2011-08-07 ENCOUNTER — Ambulatory Visit (INDEPENDENT_AMBULATORY_CARE_PROVIDER_SITE_OTHER): Payer: Medicare Other | Admitting: *Deleted

## 2011-08-07 DIAGNOSIS — I4891 Unspecified atrial fibrillation: Secondary | ICD-10-CM

## 2011-08-07 MED ORDER — LEVOTHYROXINE SODIUM 88 MCG PO TABS: 88.0000 ug | ORAL_TABLET | Freq: Every day | ORAL | Status: DC

## 2011-08-07 NOTE — Telephone Encounter (Signed)
Discussed with patient and she voiced understanding.... Rx sent to [facility]

## 2011-08-07 NOTE — Telephone Encounter (Signed)
Message copied by Arnette Norris on Wed Aug 07, 2011  9:04 AM ------      Message from: Lelon Perla      Created: Tue Aug 06, 2011  9:55 PM       Hyperthyroid----dec synthroid  #30  1 po qd---recheck 2 months-------244.9  TSH

## 2011-08-14 ENCOUNTER — Other Ambulatory Visit (HOSPITAL_COMMUNITY): Payer: Self-pay | Admitting: Family Medicine

## 2011-08-14 ENCOUNTER — Other Ambulatory Visit (HOSPITAL_COMMUNITY): Payer: Self-pay | Admitting: Internal Medicine

## 2011-08-14 ENCOUNTER — Ambulatory Visit (HOSPITAL_COMMUNITY): Admission: RE | Admit: 2011-08-14 | Payer: Medicare Other | Source: Ambulatory Visit

## 2011-08-14 ENCOUNTER — Encounter (HOSPITAL_COMMUNITY)
Admission: RE | Admit: 2011-08-14 | Discharge: 2011-08-14 | Disposition: A | Discharge status: Home / Self Care | Payer: Medicare Other | Source: Ambulatory Visit | Attending: Internal Medicine | Admitting: Internal Medicine

## 2011-08-14 DIAGNOSIS — M79609 Pain in unspecified limb: Secondary | ICD-10-CM | POA: Insufficient documentation

## 2011-08-14 DIAGNOSIS — M79642 Pain in left hand: Secondary | ICD-10-CM

## 2011-08-14 DIAGNOSIS — M20099 Other deformity of finger(s), unspecified finger(s): Secondary | ICD-10-CM | POA: Insufficient documentation

## 2011-08-14 MED ORDER — TECHNETIUM TC 99M MEDRONATE IV KIT
25.0000 | PACK | Freq: Once | INTRAVENOUS | Status: AC | PRN
  Administered 2011-08-14: 25 via INTRAVENOUS

## 2011-08-14 MED ORDER — INDIUM IN-111 PENTETREOTIDE IV KIT
6.0000 | PACK | Freq: Once | INTRAVENOUS | Status: AC | PRN
  Administered 2011-08-13: 6 via INTRAVENOUS

## 2011-08-19 ENCOUNTER — Other Ambulatory Visit: Payer: Self-pay | Admitting: Cardiology

## 2011-08-27 LAB — CBC
HCT: 40.5
MCHC: 33.8
MCV: 99.5
Platelets: 235
RDW: 14.6

## 2011-08-27 LAB — BASIC METABOLIC PANEL
BUN: 27 — ABNORMAL HIGH
CO2: 30
Chloride: 103
Creatinine, Ser: 1.55 — ABNORMAL HIGH
Glucose, Bld: 81
Potassium: 5.1

## 2011-08-28 ENCOUNTER — Telehealth: Payer: Self-pay | Admitting: *Deleted

## 2011-08-28 ENCOUNTER — Ambulatory Visit (INDEPENDENT_AMBULATORY_CARE_PROVIDER_SITE_OTHER): Payer: Medicare Other | Admitting: *Deleted

## 2011-08-28 DIAGNOSIS — I350 Nonrheumatic aortic (valve) stenosis: Secondary | ICD-10-CM | POA: Insufficient documentation

## 2011-08-28 DIAGNOSIS — Z79899 Other long term (current) drug therapy: Secondary | ICD-10-CM

## 2011-08-28 DIAGNOSIS — M25442 Effusion, left hand: Secondary | ICD-10-CM | POA: Insufficient documentation

## 2011-08-28 DIAGNOSIS — I10 Essential (primary) hypertension: Secondary | ICD-10-CM

## 2011-08-28 LAB — BASIC METABOLIC PANEL
Calcium: 8.8 mg/dL (ref 8.4–10.5)
Creatinine, Ser: 1.4 mg/dL — ABNORMAL HIGH (ref 0.4–1.2)
GFR: 36.99 mL/min — ABNORMAL LOW (ref >60.00)
Sodium: 140 mEq/L (ref 135–145)

## 2011-08-28 NOTE — Assessment & Plan Note (Signed)
Treated with warfarin.

## 2011-08-28 NOTE — Assessment & Plan Note (Signed)
Followed by Dr.  Graciela Husbands.  PPM

## 2011-08-28 NOTE — Assessment & Plan Note (Signed)
Pressures seem to be lower.

## 2011-08-28 NOTE — Assessment & Plan Note (Signed)
This is symptomatic and will need to be addressed.  She is to see an orthopedic surgeon.

## 2011-08-28 NOTE — Assessment & Plan Note (Signed)
Cannot tolerate statin 

## 2011-08-28 NOTE — Telephone Encounter (Signed)
Dr. Excell Seltzer reviewed bmp. Recommended pt take extra potassium daily and repeat bmp in 1 month. Pt is aware. Mylo Red RN

## 2011-08-28 NOTE — Assessment & Plan Note (Signed)
Probable mild AS by exam.  Check 2D echo study.

## 2011-08-30 LAB — CBC
HCT: 40.1 % (ref 36.0–46.0)
Hemoglobin: 13.5 g/dL (ref 12.0–15.0)
Platelets: 232 10*3/uL (ref 150–400)
RDW: 15.4 % (ref 11.5–15.5)
WBC: 8.7 10*3/uL (ref 4.0–10.5)

## 2011-08-30 LAB — BASIC METABOLIC PANEL
BUN: 25 mg/dL — ABNORMAL HIGH (ref 6–23)
Calcium: 9.4 mg/dL (ref 8.4–10.5)
GFR calc non Af Amer: 42 mL/min — ABNORMAL LOW (ref >60)
Glucose, Bld: 96 mg/dL (ref 70–99)
Potassium: 4.3 mEq/L (ref 3.5–5.1)
Sodium: 139 mEq/L (ref 135–145)

## 2011-08-30 LAB — PROTIME-INR: INR: 1.1 (ref 0.00–1.49)

## 2011-08-30 LAB — APTT: aPTT: 30 seconds (ref 24–37)

## 2011-09-03 ENCOUNTER — Encounter: Payer: Medicare Other | Attending: Physical Medicine & Rehabilitation

## 2011-09-03 ENCOUNTER — Ambulatory Visit (HOSPITAL_COMMUNITY)
Admission: RE | Admit: 2011-09-03 | Discharge: 2011-09-03 | Disposition: A | Discharge status: Home / Self Care | Payer: Medicare Other | Source: Ambulatory Visit | Attending: Neurosurgery | Admitting: Neurosurgery

## 2011-09-03 ENCOUNTER — Other Ambulatory Visit (HOSPITAL_COMMUNITY): Payer: Self-pay | Admitting: Neurosurgery

## 2011-09-03 ENCOUNTER — Ambulatory Visit: Payer: Medicare Other | Admitting: Physical Medicine & Rehabilitation

## 2011-09-03 DIAGNOSIS — M899 Disorder of bone, unspecified: Secondary | ICD-10-CM | POA: Insufficient documentation

## 2011-09-03 DIAGNOSIS — Q762 Congenital spondylolisthesis: Secondary | ICD-10-CM | POA: Insufficient documentation

## 2011-09-03 DIAGNOSIS — M129 Arthropathy, unspecified: Secondary | ICD-10-CM | POA: Insufficient documentation

## 2011-09-03 DIAGNOSIS — M5030 Other cervical disc degeneration, unspecified cervical region: Secondary | ICD-10-CM | POA: Insufficient documentation

## 2011-09-03 DIAGNOSIS — R29898 Other symptoms and signs involving the musculoskeletal system: Secondary | ICD-10-CM | POA: Insufficient documentation

## 2011-09-03 DIAGNOSIS — M4712 Other spondylosis with myelopathy, cervical region: Secondary | ICD-10-CM

## 2011-09-03 DIAGNOSIS — G20 Parkinson's disease: Secondary | ICD-10-CM | POA: Insufficient documentation

## 2011-09-03 DIAGNOSIS — G20A1 Parkinson's disease without dyskinesia, without mention of fluctuations: Secondary | ICD-10-CM | POA: Insufficient documentation

## 2011-09-03 DIAGNOSIS — Z950 Presence of cardiac pacemaker: Secondary | ICD-10-CM | POA: Insufficient documentation

## 2011-09-04 ENCOUNTER — Telehealth: Payer: Self-pay | Admitting: Cardiology

## 2011-09-04 ENCOUNTER — Encounter: Payer: Medicare Other | Admitting: *Deleted

## 2011-09-04 NOTE — Telephone Encounter (Signed)
Pt's dtr calling to go over conversation with lauren because pt is confused

## 2011-09-04 NOTE — Telephone Encounter (Signed)
I left a detailed message for Coralee North in regards to the pt's recent BMP.  The pt will have a BMP rechecked on 09/05/11.

## 2011-09-05 ENCOUNTER — Ambulatory Visit (INDEPENDENT_AMBULATORY_CARE_PROVIDER_SITE_OTHER): Payer: Medicare Other | Admitting: *Deleted

## 2011-09-05 ENCOUNTER — Other Ambulatory Visit (INDEPENDENT_AMBULATORY_CARE_PROVIDER_SITE_OTHER): Payer: Medicare Other | Admitting: *Deleted

## 2011-09-05 DIAGNOSIS — Z79899 Other long term (current) drug therapy: Secondary | ICD-10-CM

## 2011-09-05 DIAGNOSIS — I4891 Unspecified atrial fibrillation: Secondary | ICD-10-CM

## 2011-09-05 LAB — BASIC METABOLIC PANEL
BUN: 38 mg/dL — ABNORMAL HIGH (ref 6–23)
Chloride: 100 mEq/L (ref 96–112)
Creatinine, Ser: 1.5 mg/dL — ABNORMAL HIGH (ref 0.4–1.2)
GFR: 34.23 mL/min — ABNORMAL LOW (ref >60.00)

## 2011-09-05 LAB — POCT INR: INR: 4.8

## 2011-09-10 ENCOUNTER — Telehealth: Payer: Self-pay | Admitting: Cardiology

## 2011-09-10 DIAGNOSIS — I4891 Unspecified atrial fibrillation: Secondary | ICD-10-CM

## 2011-09-10 NOTE — Telephone Encounter (Signed)
The pt needs BMP and stool cards per Dr Riley Kill (orders placed).  I spoke with the pt's daughter and she will continue to monitor the pt's BP at home.  I made her aware that Dr Ernst Spell office needs to place an order for any other lab work that the pt may need drawn.

## 2011-09-10 NOTE — Telephone Encounter (Signed)
Pt's daughter called She has been taking her bp 120/60 or 146/80 today 120/80 and 104/60 She also is calling about her potassium Can she check her potassium on 1024 when she has her coumadin checked dr Laury Axon wants her to check her thyroid. Can she check on 1024 also. Please call

## 2011-09-17 DIAGNOSIS — R5381 Other malaise: Secondary | ICD-10-CM

## 2011-09-18 ENCOUNTER — Other Ambulatory Visit: Payer: Self-pay

## 2011-09-18 ENCOUNTER — Other Ambulatory Visit (INDEPENDENT_AMBULATORY_CARE_PROVIDER_SITE_OTHER): Payer: Medicare Other | Admitting: *Deleted

## 2011-09-18 ENCOUNTER — Ambulatory Visit (INDEPENDENT_AMBULATORY_CARE_PROVIDER_SITE_OTHER): Payer: Medicare Other | Admitting: *Deleted

## 2011-09-18 DIAGNOSIS — E039 Hypothyroidism, unspecified: Secondary | ICD-10-CM

## 2011-09-18 DIAGNOSIS — I4891 Unspecified atrial fibrillation: Secondary | ICD-10-CM

## 2011-09-18 LAB — POCT INR: INR: 2.7

## 2011-09-19 LAB — BASIC METABOLIC PANEL
Calcium: 9.8 mg/dL (ref 8.4–10.5)
GFR: 30.96 mL/min — ABNORMAL LOW (ref >60.00)
Potassium: 4.5 mEq/L (ref 3.5–5.1)
Sodium: 139 mEq/L (ref 135–145)

## 2011-09-19 LAB — TSH: TSH: 0.37 u[IU]/mL (ref 0.35–5.50)

## 2011-09-26 ENCOUNTER — Emergency Department (HOSPITAL_COMMUNITY)
Admission: EM | Admit: 2011-09-26 | Discharge: 2011-09-26 | Disposition: A | Discharge status: Home / Self Care | Payer: Medicare Other | Attending: Emergency Medicine | Admitting: Emergency Medicine

## 2011-09-26 DIAGNOSIS — I10 Essential (primary) hypertension: Secondary | ICD-10-CM | POA: Insufficient documentation

## 2011-09-26 DIAGNOSIS — R112 Nausea with vomiting, unspecified: Secondary | ICD-10-CM | POA: Insufficient documentation

## 2011-09-26 DIAGNOSIS — E785 Hyperlipidemia, unspecified: Secondary | ICD-10-CM | POA: Insufficient documentation

## 2011-09-26 DIAGNOSIS — R197 Diarrhea, unspecified: Secondary | ICD-10-CM | POA: Insufficient documentation

## 2011-09-26 DIAGNOSIS — Z950 Presence of cardiac pacemaker: Secondary | ICD-10-CM | POA: Insufficient documentation

## 2011-09-26 DIAGNOSIS — G20A1 Parkinson's disease without dyskinesia, without mention of fluctuations: Secondary | ICD-10-CM | POA: Insufficient documentation

## 2011-09-26 DIAGNOSIS — G20 Parkinson's disease: Secondary | ICD-10-CM | POA: Insufficient documentation

## 2011-09-26 LAB — URINALYSIS, ROUTINE W REFLEX MICROSCOPIC
Glucose, UA: NEGATIVE mg/dL
Protein, ur: NEGATIVE mg/dL
Specific Gravity, Urine: 1.019 (ref 1.005–1.030)
Urobilinogen, UA: 0.2 mg/dL (ref 0.0–1.0)

## 2011-09-26 LAB — CBC
Hemoglobin: 13.9 g/dL (ref 12.0–15.0)
MCH: 33.2 pg (ref 26.0–34.0)
Platelets: 181 10*3/uL (ref 150–400)
RBC: 4.19 MIL/uL (ref 3.87–5.11)
WBC: 9.6 10*3/uL (ref 4.0–10.5)

## 2011-09-26 LAB — BASIC METABOLIC PANEL
CO2: 27 mEq/L (ref 19–32)
Calcium: 10 mg/dL (ref 8.4–10.5)
Chloride: 104 mEq/L (ref 96–112)
Potassium: 3.7 mEq/L (ref 3.5–5.1)
Sodium: 141 mEq/L (ref 135–145)

## 2011-09-26 LAB — URINE MICROSCOPIC-ADD ON

## 2011-09-26 LAB — DIFFERENTIAL
Basophils Absolute: 0.1 10*3/uL (ref 0.0–0.1)
Basophils Relative: 1 % (ref 0–1)
Eosinophils Absolute: 0.2 10*3/uL (ref 0.0–0.7)
Neutro Abs: 6.7 10*3/uL (ref 1.7–7.7)
Neutrophils Relative %: 70 % (ref 43–77)

## 2011-09-30 ENCOUNTER — Other Ambulatory Visit: Payer: Medicare Other | Admitting: *Deleted

## 2011-10-02 ENCOUNTER — Other Ambulatory Visit: Payer: Self-pay | Admitting: Neurology

## 2011-10-02 DIAGNOSIS — R269 Unspecified abnormalities of gait and mobility: Secondary | ICD-10-CM

## 2011-10-02 DIAGNOSIS — G20 Parkinson's disease: Secondary | ICD-10-CM

## 2011-10-02 DIAGNOSIS — M6281 Muscle weakness (generalized): Secondary | ICD-10-CM

## 2011-10-09 ENCOUNTER — Ambulatory Visit (INDEPENDENT_AMBULATORY_CARE_PROVIDER_SITE_OTHER): Payer: Medicare Other | Admitting: *Deleted

## 2011-10-09 ENCOUNTER — Ambulatory Visit
Admission: RE | Admit: 2011-10-09 | Discharge: 2011-10-09 | Disposition: A | Discharge status: Home / Self Care | Payer: Medicare Other | Source: Ambulatory Visit | Attending: Neurology | Admitting: Neurology

## 2011-10-09 DIAGNOSIS — I4891 Unspecified atrial fibrillation: Secondary | ICD-10-CM

## 2011-10-09 DIAGNOSIS — M6281 Muscle weakness (generalized): Secondary | ICD-10-CM

## 2011-10-09 DIAGNOSIS — R269 Unspecified abnormalities of gait and mobility: Secondary | ICD-10-CM

## 2011-10-09 DIAGNOSIS — M25442 Effusion, left hand: Secondary | ICD-10-CM

## 2011-10-09 DIAGNOSIS — M25449 Effusion, unspecified hand: Secondary | ICD-10-CM

## 2011-10-09 DIAGNOSIS — G20 Parkinson's disease: Secondary | ICD-10-CM

## 2011-10-09 MED ORDER — IOHEXOL 300 MG/ML  SOLN
75.0000 mL | Freq: Once | INTRAMUSCULAR | Status: AC | PRN
  Administered 2011-10-09: 75 mL via INTRAVENOUS

## 2011-10-10 LAB — CBC WITH DIFFERENTIAL/PLATELET
Basophils Absolute: 0 10*3/uL (ref 0.0–0.1)
Eosinophils Absolute: 0.1 10*3/uL (ref 0.0–0.7)
HCT: 42.6 % (ref 36.0–46.0)
Hemoglobin: 14.3 g/dL (ref 12.0–15.0)
Lymphs Abs: 1.5 10*3/uL (ref 0.7–4.0)
MCHC: 33.5 g/dL (ref 30.0–36.0)
Neutro Abs: 7.6 10*3/uL (ref 1.4–7.7)
RDW: 16.1 % — ABNORMAL HIGH (ref 11.5–14.6)

## 2011-10-10 LAB — BASIC METABOLIC PANEL
Calcium: 9.5 mg/dL (ref 8.4–10.5)
Creatinine, Ser: 1.3 mg/dL — ABNORMAL HIGH (ref 0.4–1.2)
GFR: 42.36 mL/min — ABNORMAL LOW (ref >60.00)

## 2011-10-18 ENCOUNTER — Encounter: Payer: Self-pay | Admitting: Internal Medicine

## 2011-10-18 DIAGNOSIS — I495 Sick sinus syndrome: Secondary | ICD-10-CM

## 2011-10-28 ENCOUNTER — Encounter (HOSPITAL_COMMUNITY): Payer: Self-pay | Admitting: Radiology

## 2011-10-28 ENCOUNTER — Emergency Department (HOSPITAL_COMMUNITY): Payer: Medicare Other

## 2011-10-28 ENCOUNTER — Other Ambulatory Visit: Payer: Self-pay

## 2011-10-28 ENCOUNTER — Inpatient Hospital Stay (HOSPITAL_COMMUNITY)
Admission: EM | Admit: 2011-10-28 | Discharge: 2011-11-05 | DRG: 948 | Disposition: A | Discharge status: Skilled Nursing Facility | Payer: Medicare Other | Source: Ambulatory Visit | Attending: Internal Medicine | Admitting: Internal Medicine

## 2011-10-28 DIAGNOSIS — N289 Disorder of kidney and ureter, unspecified: Secondary | ICD-10-CM | POA: Diagnosis present

## 2011-10-28 DIAGNOSIS — R531 Weakness: Secondary | ICD-10-CM

## 2011-10-28 DIAGNOSIS — Y9210 Unspecified residential institution as the place of occurrence of the external cause: Secondary | ICD-10-CM | POA: Diagnosis not present

## 2011-10-28 DIAGNOSIS — Z950 Presence of cardiac pacemaker: Secondary | ICD-10-CM

## 2011-10-28 DIAGNOSIS — E860 Dehydration: Secondary | ICD-10-CM | POA: Diagnosis present

## 2011-10-28 DIAGNOSIS — E785 Hyperlipidemia, unspecified: Secondary | ICD-10-CM | POA: Diagnosis present

## 2011-10-28 DIAGNOSIS — Z951 Presence of aortocoronary bypass graft: Secondary | ICD-10-CM

## 2011-10-28 DIAGNOSIS — G20A1 Parkinson's disease without dyskinesia, without mention of fluctuations: Secondary | ICD-10-CM | POA: Diagnosis present

## 2011-10-28 DIAGNOSIS — I495 Sick sinus syndrome: Secondary | ICD-10-CM | POA: Insufficient documentation

## 2011-10-28 DIAGNOSIS — F329 Major depressive disorder, single episode, unspecified: Secondary | ICD-10-CM | POA: Diagnosis present

## 2011-10-28 DIAGNOSIS — G20 Parkinson's disease: Secondary | ICD-10-CM | POA: Insufficient documentation

## 2011-10-28 DIAGNOSIS — I4891 Unspecified atrial fibrillation: Secondary | ICD-10-CM | POA: Diagnosis present

## 2011-10-28 DIAGNOSIS — R5381 Other malaise: Principal | ICD-10-CM | POA: Diagnosis present

## 2011-10-28 DIAGNOSIS — T43205A Adverse effect of unspecified antidepressants, initial encounter: Secondary | ICD-10-CM | POA: Diagnosis not present

## 2011-10-28 DIAGNOSIS — Z7901 Long term (current) use of anticoagulants: Secondary | ICD-10-CM

## 2011-10-28 DIAGNOSIS — Z79899 Other long term (current) drug therapy: Secondary | ICD-10-CM

## 2011-10-28 DIAGNOSIS — M545 Low back pain, unspecified: Secondary | ICD-10-CM | POA: Diagnosis present

## 2011-10-28 DIAGNOSIS — T426X5A Adverse effect of other antiepileptic and sedative-hypnotic drugs, initial encounter: Secondary | ICD-10-CM | POA: Diagnosis not present

## 2011-10-28 DIAGNOSIS — F19951 Other psychoactive substance use, unspecified with psychoactive substance-induced psychotic disorder with hallucinations: Secondary | ICD-10-CM | POA: Diagnosis not present

## 2011-10-28 DIAGNOSIS — R451 Restlessness and agitation: Secondary | ICD-10-CM

## 2011-10-28 DIAGNOSIS — I2510 Atherosclerotic heart disease of native coronary artery without angina pectoris: Secondary | ICD-10-CM | POA: Diagnosis present

## 2011-10-28 DIAGNOSIS — G4700 Insomnia, unspecified: Secondary | ICD-10-CM | POA: Diagnosis present

## 2011-10-28 DIAGNOSIS — R269 Unspecified abnormalities of gait and mobility: Secondary | ICD-10-CM | POA: Diagnosis present

## 2011-10-28 DIAGNOSIS — I359 Nonrheumatic aortic valve disorder, unspecified: Secondary | ICD-10-CM | POA: Diagnosis present

## 2011-10-28 DIAGNOSIS — G4730 Sleep apnea, unspecified: Secondary | ICD-10-CM | POA: Diagnosis present

## 2011-10-28 DIAGNOSIS — E039 Hypothyroidism, unspecified: Secondary | ICD-10-CM | POA: Diagnosis present

## 2011-10-28 DIAGNOSIS — E876 Hypokalemia: Secondary | ICD-10-CM | POA: Diagnosis present

## 2011-10-28 DIAGNOSIS — I10 Essential (primary) hypertension: Secondary | ICD-10-CM | POA: Diagnosis present

## 2011-10-28 LAB — DIFFERENTIAL
Basophils Relative: 1 % (ref 0–1)
Lymphs Abs: 1.7 10*3/uL (ref 0.7–4.0)
Monocytes Relative: 9 % (ref 3–12)
Neutro Abs: 7.5 10*3/uL (ref 1.7–7.7)
Neutrophils Relative %: 72 % (ref 43–77)

## 2011-10-28 LAB — URINALYSIS, ROUTINE W REFLEX MICROSCOPIC
Glucose, UA: NEGATIVE mg/dL
Hgb urine dipstick: NEGATIVE
Ketones, ur: NEGATIVE mg/dL
Leukocytes, UA: NEGATIVE
Protein, ur: NEGATIVE mg/dL

## 2011-10-28 LAB — CBC
Hemoglobin: 13.8 g/dL (ref 12.0–15.0)
MCHC: 35 g/dL (ref 30.0–36.0)
RBC: 4.17 MIL/uL (ref 3.87–5.11)

## 2011-10-28 LAB — COMPREHENSIVE METABOLIC PANEL
ALT: 6 U/L (ref 0–35)
Albumin: 3 g/dL — ABNORMAL LOW (ref 3.5–5.2)
Alkaline Phosphatase: 59 U/L (ref 39–117)
BUN: 21 mg/dL (ref 6–23)
Chloride: 103 mEq/L (ref 96–112)
Potassium: 3.4 mEq/L — ABNORMAL LOW (ref 3.5–5.1)
Total Bilirubin: 0.7 mg/dL (ref 0.3–1.2)

## 2011-10-28 LAB — APTT: aPTT: 45 seconds — ABNORMAL HIGH (ref 24–37)

## 2011-10-28 LAB — PROTIME-INR: Prothrombin Time: 27 seconds — ABNORMAL HIGH (ref 11.6–15.2)

## 2011-10-28 MED ORDER — BISACODYL 10 MG RE SUPP: 10.0000 mg | Freq: Every day | RECTAL | Status: DC | PRN

## 2011-10-28 MED ORDER — PROPRANOLOL HCL ER 80 MG PO CP24
80.0000 mg | ORAL_CAPSULE | Freq: Every day | ORAL | Status: DC
  Administered 2011-10-29 – 2011-11-05 (×8): 80 mg via ORAL
  Filled 2011-10-28 (×10): qty 1

## 2011-10-28 MED ORDER — CARBIDOPA-LEVODOPA CR 25-100 MG PO TBCR
1.0000 | EXTENDED_RELEASE_TABLET | ORAL | Status: DC
  Administered 2011-10-28: 1 via ORAL
  Filled 2011-10-28: qty 1

## 2011-10-28 MED ORDER — HYDRALAZINE HCL 20 MG/ML IJ SOLN
5.0000 mg | INTRAMUSCULAR | Status: DC
  Filled 2011-10-28: qty 0.25

## 2011-10-28 MED ORDER — CARBIDOPA-LEVODOPA 25-100 MG PO TABS
1.0000 | ORAL_TABLET | Freq: Four times a day (QID) | ORAL | Status: DC
  Administered 2011-10-28 – 2011-11-05 (×29): 1 via ORAL
  Filled 2011-10-28 (×36): qty 1

## 2011-10-28 MED ORDER — HYDRALAZINE HCL 20 MG/ML IJ SOLN
5.0000 mg | Freq: Once | INTRAMUSCULAR | Status: DC
  Administered 2011-10-28: 5 mg via INTRAVENOUS

## 2011-10-28 MED ORDER — LEVOTHYROXINE SODIUM 100 MCG PO TABS
100.0000 ug | ORAL_TABLET | Freq: Every day | ORAL | Status: DC
  Administered 2011-10-28 – 2011-11-05 (×8): 100 ug via ORAL
  Filled 2011-10-28 (×9): qty 1

## 2011-10-28 MED ORDER — ENOXAPARIN SODIUM 30 MG/0.3ML ~~LOC~~ SOLN: 30.0000 mg | SUBCUTANEOUS | Status: DC

## 2011-10-28 MED ORDER — AMLODIPINE BESYLATE 5 MG PO TABS
5.0000 mg | ORAL_TABLET | Freq: Every day | ORAL | Status: DC
  Administered 2011-10-28 – 2011-10-30 (×3): 5 mg via ORAL
  Filled 2011-10-28 (×3): qty 1

## 2011-10-28 MED ORDER — WARFARIN SODIUM 1 MG PO TABS
1.0000 mg | ORAL_TABLET | ORAL | Status: DC
  Filled 2011-10-28: qty 1

## 2011-10-28 MED ORDER — OLMESARTAN 10 MG HALF TABLET
10.0000 mg | ORAL_TABLET | Freq: Every day | ORAL | Status: DC
  Administered 2011-10-28 – 2011-11-03 (×7): 10 mg via ORAL
  Filled 2011-10-28 (×7): qty 1

## 2011-10-28 MED ORDER — HYDRALAZINE HCL 20 MG/ML IJ SOLN
5.0000 mg | Freq: Three times a day (TID) | INTRAMUSCULAR | Status: DC | PRN
  Administered 2011-10-29 – 2011-10-30 (×2): 5 mg via INTRAVENOUS
  Filled 2011-10-28 (×3): qty 0.25

## 2011-10-28 MED ORDER — POLYETHYLENE GLYCOL 3350 17 G PO PACK
17.0000 g | PACK | Freq: Every day | ORAL | Status: DC | PRN
  Administered 2011-11-05: 17 g via ORAL
  Filled 2011-10-28: qty 1

## 2011-10-28 MED ORDER — WARFARIN SODIUM 1 MG PO TABS: 1.0000 mg | ORAL_TABLET | ORAL | Status: DC

## 2011-10-28 MED ORDER — FLEET ENEMA 7-19 GM/118ML RE ENEM
1.0000 | ENEMA | Freq: Once | RECTAL | Status: AC | PRN
  Filled 2011-10-28: qty 1

## 2011-10-28 MED ORDER — SERTRALINE HCL 100 MG PO TABS
100.0000 mg | ORAL_TABLET | Freq: Every day | ORAL | Status: DC
  Administered 2011-10-28 – 2011-11-01 (×5): 100 mg via ORAL
  Filled 2011-10-28 (×5): qty 1

## 2011-10-28 MED ORDER — SODIUM CHLORIDE 0.9 % IV SOLN: Freq: Once | INTRAVENOUS | Status: DC

## 2011-10-28 MED ORDER — ACETAMINOPHEN 325 MG PO TABS
650.0000 mg | ORAL_TABLET | Freq: Four times a day (QID) | ORAL | Status: DC | PRN
  Administered 2011-10-28 – 2011-10-29 (×3): 650 mg via ORAL
  Administered 2011-10-30: 325 mg via ORAL
  Administered 2011-10-31 – 2011-11-04 (×7): 650 mg via ORAL
  Filled 2011-10-28 (×4): qty 2
  Filled 2011-10-28: qty 1
  Filled 2011-10-28 (×7): qty 2

## 2011-10-28 MED ORDER — FOLIC ACID 1 MG PO TABS
1.0000 mg | ORAL_TABLET | Freq: Every day | ORAL | Status: DC
  Administered 2011-10-28 – 2011-11-05 (×9): 1 mg via ORAL
  Filled 2011-10-28 (×9): qty 1

## 2011-10-28 MED ORDER — SODIUM CHLORIDE 0.9 % IV BOLUS (SEPSIS)
500.0000 mL | Freq: Once | INTRAVENOUS | Status: AC
  Administered 2011-10-28: 1000 mL via INTRAVENOUS

## 2011-10-28 MED ORDER — WARFARIN SODIUM 2 MG PO TABS
2.0000 mg | ORAL_TABLET | ORAL | Status: DC
  Filled 2011-10-28: qty 1

## 2011-10-28 MED ORDER — WARFARIN SODIUM 2 MG PO TABS
2.0000 mg | ORAL_TABLET | ORAL | Status: DC
  Administered 2011-10-28: 2 mg via ORAL
  Filled 2011-10-28 (×2): qty 1

## 2011-10-28 MED ORDER — POTASSIUM CHLORIDE IN NACL 20-0.9 MEQ/L-% IV SOLN
INTRAVENOUS | Status: DC
  Administered 2011-10-28 – 2011-10-30 (×3): via INTRAVENOUS
  Administered 2011-10-31: 1000 mL via INTRAVENOUS
  Administered 2011-11-01: 06:00:00 via INTRAVENOUS
  Administered 2011-11-01: 1000 mL via INTRAVENOUS
  Administered 2011-11-03: 21:00:00 via INTRAVENOUS
  Filled 2011-10-28 (×11): qty 1000

## 2011-10-28 MED ORDER — FOLIC ACID 400 MCG PO TABS: 400.0000 ug | ORAL_TABLET | Freq: Every day | ORAL | Status: DC

## 2011-10-28 NOTE — ED Notes (Signed)
Gave old and new ECG to Dr. Lynelle Doctor after I performed.am JG

## 2011-10-28 NOTE — ED Notes (Signed)
Verbal order from admitting md to cancel hydralizine

## 2011-10-28 NOTE — ED Notes (Signed)
Per ED MD Pt may take sinemet and b/p medication from home at this time

## 2011-10-28 NOTE — H&P (Signed)
Hospital Admission Note Date: 10/28/2011  Patient name: Colleen Morris Medical record number: [id number] Date of birth: 1927/05/22 Age: 77 y.o. Gender: female PCP: Loreen Freud, DO, DO  Attending physician: Ward Givens, MD  Chief Complaint: Weakness and refusal to eat x2 days. History of Present Illness: Ms. Boehning is a lovely 77 year old who has been diagnosed as Parkinson's greater than 7 years her stepdaughter 9 a list of her states that in the last 2 days she had difficulty sleeping at night and then since then has become rather weak and unable to walk. She also states that she has refused eating anything or take her medications. Noted to her stepdaughter he baseline function is that she is able to ambulate with assistance and was doing so up until 2 days ago. The stepdaughter relates that she has difficulty with initiating movement with it she felt that her ambulation was a normal gait. The patient's sister is present however and describes a decline in her function and starting about greater than 6 months ago to which she feels is a shuffling gait. They both however agree the patient's appetite has been reasonably well up until 2 days ago. Patient denies any dizziness, chest pain, loss of consciousness, cough, fever chills, nausea vomiting or diarrhea. She also states that she does not want to take her by mouth as she feels they're in correct. Scheduled Meds:   . sodium chloride   Intravenous Once  . amLODipine  5 mg Oral Daily  . carbidopa-levodopa  1 tablet Oral QID  . enoxaparin  30 mg Subcutaneous Q24H  . folic acid  400 mcg Oral Daily  . levothyroxine  100 mcg Oral Daily  . olmesartan  10 mg Oral Daily  . propranolol  80 mg Oral Daily  . sertraline  100 mg Oral Daily  . sodium chloride  500 mL Intravenous Once  . DISCONTD: carbidopa-levodopa  1 tablet Oral NOW  . DISCONTD: hydrALAZINE  5 mg Intravenous NOW   Continuous Infusions:   . 0.9 % NaCl with KCl 20 mEq / L     PRN  Meds:.bisacodyl, polyethylene glycol, sodium phosphate Allergies: Valium; Amlodipine besy-benazepril hcl; Amoxicillin; Cephalexin; Codeine; Codeine phosphate; Keflex; Labetalol hcl; Levofloxacin; Niacin; Penicillins; and Terazosin hcl Past Medical History  Diagnosis Date  . CAD (coronary artery disease)   . Atrial fibrillation   . Hypertension   . Hyperlipidemia   . Depression   . Anxiety   . AV block   . Parkinson's disease   . Hypothyroid   . Orthostatic hypotension   . Renal insufficiency   . Sinoatrial node dysfunction   . Dizziness   . Hypercholesteremia   . Cough   . Sinusitis   . Parkinson disease   . Low back pain syndrome   . Femoral bruit   . Sleep apnea   . Diverticulitis, colon   . Anemia    Past Surgical History  Procedure Date  . Pacemaker insertion   . Coronary artery bypass graft   . Cataract extraction   . Polypectomy   . Right l4-5 far-lateral metrix microdiscectomy    Family History  Problem Relation Age of Onset  . Heart disease Sister   . Heart failure Brother     CHF  . Emphysema Sister   . Osteoporosis Sister   . Alzheimer's disease Sister    History   Social History  . Marital Status: Widowed    Spouse Name: N/A    Number of Children:  N/A  . Years of Education: N/A  Patient lives with his stepdaughter Coralee North who is her healthcare power of attorney as recorded on her healthcare power of attorney records. The patient is awake and alert and states that she does not want any resuscitation attempted on her in the event of cardiopulmonary arrest. Occupational History  . retired Engineer, civil (consulting)    Social History Main Topics  . Smoking status: Never Smoker   . Smokeless tobacco: Never Used  . Alcohol Use: No  . Drug Use: No  . Sexually Active: Not on file   Other Topics Concern  . Not on file   Social History Narrative  . No narrative on file   Review of Systems: All systems negative except as noted in HPI. Physical Exam: No intake or  output data in the 24 hours ending 10/28/11 1239 General: Alert, awake, oriented x3, in no acute distress.  HEENT: [place]/AT PEERL, EOMI Neck: Trachea midline,  no masses, no thyromegal,y no JVD, no carotid bruit OROPHARYNX:  Moist, No exudate/ erythema/lesions.  Heart: Regular rate and rhythm, without murmurs, rubs, gallops, PMI non-displaced, no heaves or thrills on palpation.  Lungs: Patient has a kyphotic posture Clear to auscultation, no wheezing or rhonchi noted. No increased vocal fremitus resonant to percussion  Abdomen: Soft, nontender, nondistended, positive bowel sounds, no masses no hepatosplenomegaly noted..  Neuro: No focal neurological deficits noted cranial nerves II through XII grossly intact.  Musculoskeletal: No warm swelling or erythema around joints, however patient does have tenderness in the bilateral shoulder joints and in the bilateral legs. Psychiatric: Patient alert and oriented x3, good insight and cognition, good recent to remote recall. Lymph node survey: No cervical axillary or inguinal lymphadenopathy noted.  Lab results:  [facility] 10/28/11 0849  NA 139  K 3.4*  CL 103  CO2 27  GLUCOSE 90  BUN 21  CREATININE 1.19*  CALCIUM 9.4  MG --  PHOS --    Basename 10/28/11 0849  AST 10  ALT 6  ALKPHOS 59  BILITOT 0.7  PROT 5.6*  ALBUMIN 3.0*   No results found for this basename: LIPASE:2,AMYLASE:2 in the last 72 hours  Basename 10/28/11 0849  WBC 10.4  NEUTROABS 7.5  HGB 13.8  HCT 39.4  MCV 94.5  PLT 232   No results found for this basename: CKTOTAL:3,CKMB:3,CKMBINDEX:3,TROPONINI:3 in the last 72 hours No results found for this basename: POCBNP:3 in the last 72 hours No results found for this basename: DDIMER:2 in the last 72 hours No results found for this basename: HGBA1C:2 in the last 72 hours No results found for this basename: CHOL:2,HDL:2,LDLCALC:2,TRIG:2,CHOLHDL:2,LDLDIRECT:2 in the last 72 hours No results found for this basename:  TSH,T4TOTAL,FREET3,T3FREE,THYROIDAB in the last 72 hours No results found for this basename: VITAMINB12:2,FOLATE:2,FERRITIN:2,TIBC:2,IRON:2,RETICCTPCT:2 in the last 72 hours Imaging results:  Dg Chest Portable 1 View  10/28/2011  *RADIOLOGY REPORT*  Clinical Data: Weakness.  Shortness of breath and hypertension  PORTABLE CHEST - 1 VIEW  Comparison: 09/02/2011  Findings:  There is a left chest wall pacer device with lead in the right atrial appendage and right ventricle.  The heart size appears normal.  There is no pleural effusion or pulmonary edema.  No airspace consolidation.  Low lung volumes noted.  IMPRESSION:  1.  No acute cardiopulmonary abnormalities  Original Report Authenticated By: Rosealee Albee, M.D.   Other results: Patient Active Hospital Problem List: Generalized weakness (10/28/2011)   Assessment: It is unclear to the etiology of the patient's sudden decline  in her functional mobility. Presently am unable to identify any acute reversible condition that could lead to this and this seems to be positive progression of the patient's Parkinson's disease. Although her initial urinalysis is not suggestive of a urinary tract infection. I will send the urine for culture for further evaluation. The patient is being admitted on observation basis and we'll observe her clinical course. I last patient and his pathologist to see the patient for swallowing evaluation.   will continue her usual chronic home medications without any changes. PARKINSON'S DISEASE (04/03/2009)   Assessment: Continue her Sinemet and asked physical therapy to evaluate the patient appear    HYPOTHYROIDISM (08/29/2008)   Assessment: Check a TSH on the patient as discussed certainly contribute to the patient's weakness.     HYPERTENSION (08/29/2008)   Assessment: Patient's blood pressures are adequately controlled although not optimal at this time we'll continue to assess the patient and if need be and IV medications the  patient continues to refuse oral intake.   Sinoatrial node dysfunction (04/25/2009)   Assessment: The patient has a pacemaker and on telemetry shows a paced rhythm.     Gait disorder (10/28/2011)   Assessment: PT and OT eval.   Mild hypokalemia (10/28/2011)  Replete    Chronic anticoagulation (10/28/2011)  INR is therapeutic at 2.45. Her last pharmacy to titrate her medications.   MATTHEWS,MICHELLE A. Pager #: [phone] 10/28/2011, 12:39 PM

## 2011-10-28 NOTE — ED Notes (Signed)
Consulting MD at bedside

## 2011-10-28 NOTE — ED Notes (Signed)
Blood pressure addressed with pcp. One time order for hydralazine ordered and given. Report called to 4500. Pt to be transferred upstairs. Resting comfortably no complaints at present.

## 2011-10-28 NOTE — ED Notes (Signed)
Bed assignment request blank at this time. Paged admitting MD

## 2011-10-28 NOTE — ED Notes (Addendum)
Pt was transported by EMS related to weakness X 2 days. Pt has a Hx of parkinson's pt has not taken her medications since yesterday. Pt was initially  90% on RA cbg 119

## 2011-10-28 NOTE — ED Provider Notes (Signed)
History     CSN: [id number] Arrival date & time: 10/28/2011  7:59 AM   First MD Initiated Contact with Patient 10/28/11 [phone]      Chief Complaint  Patient presents with  . Weakness   Level V caveat he should is a very poor historian (Consider location/radiation/quality/duration/timing/severity/associated sxs/prior treatment) Patient is a 77 y.o. female presenting with weakness.  Weakness  Additional symptoms include weakness.   Per patient's daughter who  is her caregiver, patient has been getting weak over the past couple weeks but getting more intense over the past week. She has not been sleeping at night because of leg cramping and has had decreased appetite. She used to be and Lipitor he with assistance but now she's unable to walk. Daughter states she has Parkinson's on her right and is weak on that side. She's also been evaluated recently by Dr. Venetia Maxon and Dr. Gwynne Edinger for weakness in her left hand that is felt to be I nerve problem. They have ordered [facility] to come in and start doing physical therapy to see if she can get her strength back. Yesterday she didn't need it all and just slept all day. They deny fever cough vomiting or diarrhea.  Primary care physician Dr. Laury Axon Neurosurgeon Dr. Venetia Maxon Cardiologist Dr. Tedra Senegal and Graciela Husbands Rheumatologist Dr. Dierdre Forth  Past Medical History  Diagnosis Date  . CAD (coronary artery disease)   . Atrial fibrillation   . Hypertension   . Hyperlipidemia   . Depression   . Anxiety   . AV block   . Parkinson's disease   . Hypothyroid   . Orthostatic hypotension   . Renal insufficiency   . Sinoatrial node dysfunction   . Dizziness   . Hypercholesteremia   . Cough   . Sinusitis   . Parkinson disease   . Low back pain syndrome   . Femoral bruit   . Sleep apnea   . Diverticulitis, colon   . Anemia     Past Surgical History  Procedure Date  . Pacemaker insertion   . Coronary artery bypass graft   . Cataract extraction     . Polypectomy   . Right l4-5 far-lateral metrix microdiscectomy     Family History  Problem Relation Age of Onset  . Heart disease Sister   . Heart failure Brother     CHF  . Emphysema Sister   . Osteoporosis Sister   . Alzheimer's disease Sister     History  Substance Use Topics  . Smoking status: Never Smoker   . Smokeless tobacco: Never Used  . Alcohol Use: No   lives at home with her daughter Patient currently nonambulatory  OB History    Grav Para Term Preterm Abortions TAB SAB Ect Mult Living                  Review of Systems  Neurological: Positive for weakness.  All other systems reviewed and are negative.    Allergies  Valium; Amlodipine besy-benazepril hcl; Amoxicillin; Cephalexin; Codeine; Codeine phosphate; Keflex; Labetalol hcl; Levofloxacin; Niacin; Penicillins; and Terazosin hcl  Home Medications   Current Outpatient Rx  Name Route Sig Dispense Refill  . AMLODIPINE BESYLATE 5 MG PO TABS Oral Take 5 mg by mouth daily.     [name] CALCIUM 1200 PO Oral Take 1 tablet by mouth daily.      [name] CARBIDOPA-LEVODOPA 25-100 MG PO TABS Oral Take 1 tablet by mouth 4 (four) times daily. Take 1 tablet 3  times daily and 1 tablet at bedtime. 0600  1200  1700  2100    . CO Q 10 100 MG PO CAPS Oral Take 1 capsule by mouth daily.      [name] SLEEP AID PO Oral Take 1 tablet by mouth at bedtime as needed. To help sleep.     [name] FOLIC ACID 400 MCG PO TABS Oral Take 400 mcg by mouth daily.     [name] LEVOTHYROXINE SODIUM 100 MCG PO TABS Oral Take 100 mcg by mouth daily.      [name] POTASSIUM GLUCONATE 595 MG PO TBCR Oral Take by mouth daily.     [name] PROPRANOLOL HCL ER 80 MG PO CP24 Oral Take 80 mg by mouth daily.      . SERTRALINE HCL 100 MG PO TABS Oral Take 100 mg by mouth daily.      [name] VALSARTAN 80 MG PO TABS Oral Take 1 tablet (80 mg total) by mouth daily. 30 tablet 2  . WARFARIN SODIUM 2 MG PO TABS Oral Take 2 mg by mouth as directed. Sunday and Wednesday: 0.5 tablet (1mg )  All other days 1  tablet (2mg )     . NITROGLYCERIN 0.4 MG SL SUBL Sublingual Place 0.4 mg under the tongue every 5 (five) minutes x 3 doses as needed. 1 tablet under tongue at onset of chest pain may repeat in 5 min for up to 3 doses.    . ONDANSETRON HCL 8 MG PO TABS Oral Take 8 mg by mouth every 8 (eight) hours as needed. For nausea.       BP 186/71  Pulse 60  Temp(Src) 98.4 F (36.9 C) (Rectal)  Resp 14  SpO2 97%   Vital signs hypertension otherwise normal  Physical Exam  Vitals reviewed. Constitutional: She appears well-developed and well-nourished.  Non-toxic appearance. She does not appear ill. No distress.  HENT:  Head: Normocephalic and atraumatic.  Right Ear: External ear normal.  Left Ear: External ear normal.  Nose: Nose normal. No mucosal edema or rhinorrhea.  Mouth/Throat: Mucous membranes are normal. No dental abscesses or uvula swelling.       mucous membranes dry  Eyes: Conjunctivae and EOM are normal. Pupils are equal, round, and reactive to light.  Neck: Normal range of motion and full passive range of motion without pain. Neck supple.  Cardiovascular: Normal rate, regular rhythm and normal heart sounds.  Exam reveals no gallop and no friction rub.   No murmur heard. Pulmonary/Chest: Effort normal and breath sounds normal. No respiratory distress. She has no wheezes. She has no rhonchi. She has no rales. She exhibits no tenderness and no crepitus.  Abdominal: Soft. Normal appearance and bowel sounds are normal. She exhibits no distension. There is no tenderness. There is no rebound and no guarding.  Musculoskeletal: Normal range of motion. She exhibits no edema and no tenderness.       Moves all extremities well. Patient's noted to have mild diffuse way of her left hand. She has difficulty extending her fingers and keeps her fingers flexed  Neurological: She is alert. She has normal strength. No cranial nerve deficit.       Patient is mentally slow to respond  Skin: Skin is warm,  dry and intact. No rash noted. No erythema. No pallor.  Psychiatric: Her speech is normal and behavior is normal. Her mood appears not anxious.       Affect is very flat    ED Course  Procedures (including critical care  time)  Given IV fluids for her dehydration  10:58 Dr Ashley Royalty will see in ED  Results for orders placed during the hospital encounter of 10/28/11  CBC      Component Value Range   WBC 10.4  4.0 - 10.5 (K/uL)   RBC 4.17  3.87 - 5.11 (MIL/uL)   Hemoglobin 13.8  12.0 - 15.0 (g/dL)   HCT 91.4  78.2 - 95.6 (%)   MCV 94.5  78.0 - 100.0 (fL)   MCH 33.1  26.0 - 34.0 (pg)   MCHC 35.0  30.0 - 36.0 (g/dL)   RDW 21.3  08.6 - 57.8 (%)   Platelets 232  150 - 400 (K/uL)  DIFFERENTIAL      Component Value Range   Neutrophils Relative 72  43 - 77 (%)   Neutro Abs 7.5  1.7 - 7.7 (K/uL)   Lymphocytes Relative 16  12 - 46 (%)   Lymphs Abs 1.7  0.7 - 4.0 (K/uL)   Monocytes Relative 9  3 - 12 (%)   Monocytes Absolute 0.9  0.1 - 1.0 (K/uL)   Eosinophils Relative 3  0 - 5 (%)   Eosinophils Absolute 0.3  0.0 - 0.7 (K/uL)   Basophils Relative 1  0 - 1 (%)   Basophils Absolute 0.1  0.0 - 0.1 (K/uL)  COMPREHENSIVE METABOLIC PANEL      Component Value Range   Sodium 139  135 - 145 (mEq/L)   Potassium 3.4 (*) 3.5 - 5.1 (mEq/L)   Chloride 103  96 - 112 (mEq/L)   CO2 27  19 - 32 (mEq/L)   Glucose, Bld 90  70 - 99 (mg/dL)   BUN 21  6 - 23 (mg/dL)   Creatinine, Ser 4.69 (*) 0.50 - 1.10 (mg/dL)   Calcium 9.4  8.4 - 62.9 (mg/dL)   Total Protein 5.6 (*) 6.0 - 8.3 (g/dL)   Albumin 3.0 (*) 3.5 - 5.2 (g/dL)   AST 10  0 - 37 (U/L)   ALT 6  0 - 35 (U/L)   Alkaline Phosphatase 59  39 - 117 (U/L)   Total Bilirubin 0.7  0.3 - 1.2 (mg/dL)   GFR calc non Af Amer 41 (*) >90 (mL/min)   GFR calc Af Amer 48 (*) >90 (mL/min)  URINALYSIS, ROUTINE W REFLEX MICROSCOPIC      Component Value Range   Color, Urine AMBER (*) YELLOW    APPearance CLEAR  CLEAR    Specific Gravity, Urine 1.019  1.005 -  1.030    pH 6.5  5.0 - 8.0    Glucose, UA NEGATIVE  NEGATIVE (mg/dL)   Hgb urine dipstick NEGATIVE  NEGATIVE    Bilirubin Urine SMALL (*) NEGATIVE    Ketones, ur NEGATIVE  NEGATIVE (mg/dL)   Protein, ur NEGATIVE  NEGATIVE (mg/dL)   Urobilinogen, UA 1.0  0.0 - 1.0 (mg/dL)   Nitrite NEGATIVE  NEGATIVE    Leukocytes, UA NEGATIVE  NEGATIVE   APTT      Component Value Range   aPTT 45 (*) 24 - 37 (seconds)  PROTIME-INR      Component Value Range   Prothrombin Time 27.0 (*) 11.6 - 15.2 (seconds)   INR 2.45 (*) 0.00 - 1.49   POCT I-STAT TROPONIN I      Component Value Range   Troponin i, poc 0.04  0.00 - 0.08 (ng/mL)   Comment 3            Laboratory interpretation mild hypokalemia, renal  insufficiency, malnutrition, therapeutic Coumadin level was normal   Dg Chest Portable 1 View  10/28/2011  *RADIOLOGY REPORT*  Clinical Data: Weakness.  Shortness of breath and hypertension  PORTABLE CHEST - 1 VIEW  Comparison: 09/02/2011  Findings:  There is a left chest wall pacer device with lead in the right atrial appendage and right ventricle.  The heart size appears normal.  There is no pleural effusion or pulmonary edema.  No airspace consolidation.  Low lung volumes noted.  IMPRESSION:  1.  No acute cardiopulmonary abnormalities  Original Report Authenticated By: Rosealee Albee, M.D.      Date: 10/28/2011  Rate: 69  Rhythm: normal sinus rhythm  QRS Axis: left  Intervals: normal  ST/T Wave abnormalities: normal  Conduction Disutrbances:nonspecific intraventricular conduction delay  Narrative Interpretation:   Old EKG Reviewed: unchanged from 09/26/2011 except had pacer spikes on last tracing    Diagnoses that have been ruled out:  Diagnoses that are still under consideration:  Final diagnoses:  HTN (hypertension)  Weakness  Dehydration   Plan  Admit to observation  Devoria Albe, MD, Armando Gang    MDM          Ward Givens, MD 10/28/11 1544

## 2011-10-28 NOTE — Progress Notes (Signed)
PHARMACY - ANTICOAGULATION CONSULT INITIAL NOTE  Pharmacy Consult for: Coumadin  Indication: atrial fibrillation    Patient Data:   Allergies: Allergies  Allergen Reactions  . Valium Other (See Comments)    "loopy"  . Amlodipine Besy-Benazepril Hcl Swelling and Rash  . Amoxicillin Swelling and Rash  . Cephalexin Swelling and Rash  . Codeine Swelling and Rash  . Codeine Phosphate Swelling and Rash  . Keflex Swelling and Rash  . Labetalol Hcl Swelling and Rash  . Levofloxacin Swelling and Rash  . Niacin Swelling and Rash  . Penicillins Swelling and Rash  . Terazosin Hcl Swelling and Rash    Patient Measurements:   Adjusted Body Weight:   Vital Signs: Temp:  [98.4 F (36.9 C)] 98.4 F (36.9 C) (12/03 0818) Pulse Rate:  [65] 65  (12/03 0818) Resp:  [15] 15  (12/03 0818) BP: (170-190)/(80-90) 190/80 mmHg (12/03 0818) SpO2:  [98 %-100 %] 100 % (12/03 0818) FiO2 (%):  [98 %] 98 % (12/03 0800)  Intake/Output from previous day: No intake or output data in the 24 hours ending 10/28/11 1314  Labs:  [facility] 10/28/11 0849  HGB 13.8  HCT 39.4  PLT 232  APTT 45*  LABPROT 27.0*  INR 2.45*  HEPARINUNFRC --  CREATININE 1.19*  CKTOTAL --  CKMB --  TROPONINI --   The CrCl is unknown because both a height and weight (above a minimum accepted value) are required for this calculation.  Medical History: Past Medical History  Diagnosis Date  . CAD (coronary artery disease)   . Atrial fibrillation   . Hypertension   . Hyperlipidemia   . Depression   . Anxiety   . AV block   . Parkinson's disease   . Hypothyroid   . Orthostatic hypotension   . Renal insufficiency   . Sinoatrial node dysfunction   . Dizziness   . Hypercholesteremia   . Cough   . Sinusitis   . Parkinson disease   . Low back pain syndrome   . Femoral bruit   . Sleep apnea   . Diverticulitis, colon   . Anemia     Scheduled medications:     . sodium chloride   Intravenous Once  . amLODipine   5 mg Oral Daily  . carbidopa-levodopa  1 tablet Oral QID  . enoxaparin  30 mg Subcutaneous Q24H  . folic acid  400 mcg Oral Daily  . levothyroxine  100 mcg Oral Daily  . olmesartan  10 mg Oral Daily  . propranolol  80 mg Oral Daily  . sertraline  100 mg Oral Daily  . sodium chloride  500 mL Intravenous Once  . DISCONTD: carbidopa-levodopa  1 tablet Oral NOW  . DISCONTD: hydrALAZINE  5 mg Intravenous NOW     Assessment:  77 y.o. female admitted on 10/28/2011, with history of atrial fibrillation. Pharmacy consulted to manage Coumadin. Per patient family, last dose taken on 12/1. INR is at-goal.   Goal of Therapy:  1. INR 2-3  Plan:  1. Continue home regimen of Coumadin 2mg  daily, except 1mg  Coumadin on Sunday and Wednesday.  2. Daily PT / INR.    Dineen Kid Thad Ranger, PharmD 10/28/2011, 1:14 PM

## 2011-10-29 LAB — URINALYSIS, ROUTINE W REFLEX MICROSCOPIC
Glucose, UA: NEGATIVE mg/dL
Hgb urine dipstick: NEGATIVE
Protein, ur: NEGATIVE mg/dL
Specific Gravity, Urine: 1.019 (ref 1.005–1.030)
pH: 7 (ref 5.0–8.0)

## 2011-10-29 LAB — URINE MICROSCOPIC-ADD ON

## 2011-10-29 LAB — BASIC METABOLIC PANEL
BUN: 20 mg/dL (ref 6–23)
CO2: 23 mEq/L (ref 19–32)
Calcium: 8.6 mg/dL (ref 8.4–10.5)
Chloride: 108 mEq/L (ref 96–112)
Creatinine, Ser: 0.94 mg/dL (ref 0.50–1.10)
GFR calc Af Amer: 64 mL/min — ABNORMAL LOW (ref >90)
GFR calc non Af Amer: 55 mL/min — ABNORMAL LOW (ref >90)
Glucose, Bld: 86 mg/dL (ref 70–99)
Potassium: 3.4 mEq/L — ABNORMAL LOW (ref 3.5–5.1)
Sodium: 142 mEq/L (ref 135–145)

## 2011-10-29 LAB — CBC
Hemoglobin: 12.9 g/dL (ref 12.0–15.0)
MCV: 95.3 fL (ref 78.0–100.0)
Platelets: 216 10*3/uL (ref 150–400)
RBC: 4.05 MIL/uL (ref 3.87–5.11)
WBC: 8.6 10*3/uL (ref 4.0–10.5)

## 2011-10-29 LAB — PROTIME-INR: Prothrombin Time: 31.9 seconds — ABNORMAL HIGH (ref 11.6–15.2)

## 2011-10-29 LAB — URINE CULTURE
Colony Count: NO GROWTH
Culture: NO GROWTH

## 2011-10-29 MED ORDER — MORPHINE SULFATE 2 MG/ML IJ SOLN
1.0000 mg | INTRAMUSCULAR | Status: AC
  Administered 2011-10-29: 1 mg via INTRAVENOUS

## 2011-10-29 MED ORDER — POTASSIUM CHLORIDE CRYS ER 20 MEQ PO TBCR
40.0000 meq | EXTENDED_RELEASE_TABLET | ORAL | Status: AC
  Administered 2011-10-29: 40 meq via ORAL
  Filled 2011-10-29 (×2): qty 2

## 2011-10-29 MED ORDER — MORPHINE SULFATE 2 MG/ML IJ SOLN
2.0000 mg | INTRAMUSCULAR | Status: DC
  Filled 2011-10-29: qty 1

## 2011-10-29 MED ORDER — WARFARIN SODIUM 2 MG PO TABS: 2.0000 mg | ORAL_TABLET | ORAL | Status: DC

## 2011-10-29 NOTE — Progress Notes (Signed)
Physical Therapy Evaluation Patient Details Name: Colleen Morris MRN: [id number] DOB: 1927/10/07 Today's Date: 10/29/2011  Problem List:  Patient Active Problem List  Diagnoses  . HYPOTHYROIDISM  . HYPERCHOLESTEROLEMIA  IIA  . HYPERLIPIDEMIA  . ANEMIA-NOS  . ANXIETY  . DEPRESSION  . PARKINSON'S DISEASE  . HYPERTENSION  . CORONARY ARTERY DISEASE  . AV BLOCK, COMPLETE  . ATRIAL FIBRILLATION  . Sinoatrial node dysfunction  . HYPOTENSION, ORTHOSTATIC  . SINUSITIS - ACUTE-NOS  . DIVERTICULOSIS, COLON  . RENAL INSUFFICIENCY  . LOW BACK PAIN SYNDROME  . DIZZINESS  . SLEEP APNEA  . Other Symptoms Involving Cardiovascular System  . COUGH  . POLYPECTOMY, HX OF  . CATARACT EXTRACTION, HX OF  . CORONARY ARTERY BYPASS GRAFT, HX OF  . Cardiac pacemaker- [employer]  . Aortic stenosis  . Swelling of joint, hand, left  . Generalized weakness  . Gait disorder    Past Medical History:  Past Medical History  Diagnosis Date  . CAD (coronary artery disease)   . Atrial fibrillation   . Hypertension   . Hyperlipidemia   . Depression   . Anxiety   . AV block   . Parkinson's disease   . Hypothyroid   . Orthostatic hypotension   . Renal insufficiency   . Sinoatrial node dysfunction   . Dizziness   . Hypercholesteremia   . Cough   . Sinusitis   . Parkinson disease   . Low back pain syndrome   . Femoral bruit   . Sleep apnea   . Diverticulitis, colon   . Anemia    Past Surgical History:  Past Surgical History  Procedure Date  . Pacemaker insertion   . Coronary artery bypass graft   . Cataract extraction   . Polypectomy   . Right l4-5 far-lateral metrix microdiscectomy     PT Assessment/Plan/Recommendation PT Assessment Clinical Impression Statement: pt with weakness and history of Parkinson's who lives with her Dtr and plans to both move to Independent Living the first of the year.   PT Recommendation/Assessment: Patient will need skilled PT in the acute care  venue PT Problem List: Decreased strength;Decreased activity tolerance;Decreased range of motion;Decreased balance;Decreased mobility;Decreased coordination;Decreased cognition;Decreased knowledge of use of DME;Decreased safety awareness Barriers to Discharge: None PT Therapy Diagnosis : Difficulty walking;Generalized weakness PT Plan PT Frequency: Min 2X/week PT Treatment/Interventions: DME instruction;Gait training;Functional mobility training;Therapeutic activities;Therapeutic exercise;Balance training;Neuromuscular re-education;Patient/family education PT Recommendation Recommendations for Other Services: OT consult Follow Up Recommendations: Skilled nursing facility Equipment Recommended: Defer to next venue PT Goals  Acute Rehab PT Goals PT Goal Formulation: With patient/family Time For Goal Achievement: 2 weeks Pt will go Supine/Side to Sit: with supervision PT Goal: Supine/Side to Sit - Progress: Progressing toward goal Pt will Sit at [facility] of Bed: with supervision;with no upper extremity support;3-5 min PT Goal: Sit at Edge Of Bed - Progress: Progressing toward goal Pt will go Sit to Stand: with min assist PT Goal: Sit to Stand - Progress: Progressing toward goal Pt will Ambulate: 51 - 150 feet;with min assist;with rolling walker PT Goal: Ambulate - Progress: Progressing toward goal  PT Evaluation Precautions/Restrictions  Precautions Precautions: Fall Restrictions Weight Bearing Restrictions: No Prior Functioning  Home Living Lives With: Daughter Receives Help From: Family Type of Home:  (Currently Townhouse, but be in Independent living 11/25/10) Home Layout: Two level (Will be 1 level at Independent Living) Prior Function Level of Independence: Independent with basic ADLs;Independent with transfers;Independent with gait;Needs assistance with homemaking Able  to Take Stairs?: Yes Driving: No Cognition Cognition Orientation Level: Oriented to person;Oriented to  place;Disoriented to situation;Disoriented to time Sensation/Coordination   Extremity Assessment RLE Assessment RLE Assessment: Exceptions to [facility] RLE Strength RLE Overall Strength Comments: pt with increased tone in LE secondary to Parkinson's LLE Assessment LLE Assessment: Exceptions to [facility] LLE Strength LLE Overall Strength Comments: pt with increased tone in LE secondary to Parkinson's Mobility (including Balance) Bed Mobility Bed Mobility: Yes Supine to Sit: 3: Mod assist Supine to Sit Details (indicate cue type and reason): cues for technique Sitting - Scoot to Edge of Bed: 2: Max assist Sitting - Scoot to [employer] of Bed Details (indicate cue type and reason): pt fearful of falling and fearful with scoot to EOB.   Transfers Transfers: Yes Stand Pivot Transfers: 1: +2 Total assist Stand Pivot Transfer Details (indicate cue type and reason): cues and encouragement for step-by-step through transfer.   Ambulation/Gait Ambulation/Gait: No Stairs: No Wheelchair Mobility Wheelchair Mobility: No    Exercise    End of Session PT - End of Session Equipment Utilized During Treatment: Gait belt Activity Tolerance: Patient limited by fatigue (limited by fear of falling) Patient left: in chair;with call bell in reach;with family/visitor present Nurse Communication: Mobility status for transfers General Behavior During Session: [facility] for tasks performed Cognition: Impaired, at baseline  [name], [place] [phone] 10/29/2011, 2:58 PM

## 2011-10-29 NOTE — Progress Notes (Signed)
Subjective: Patient sitting up in a chair and looks much more comfortable today. Patient's daughter at the bedside and states that after having received 1 mg of morphine she was much more comfortable and was willing to have better oral intake. The patient still is not eating solid food well however she did tolerate fluids well today. Appreciate input from speech language pathology and physical therapy. Objective: Filed Vitals:   10/29/11 1116 10/29/11 1117 10/29/11 1500 10/29/11 1926  BP: 206/80 206/80 156/72 172/77  Pulse:  58 75 76  Temp:   97.1 F (36.2 C) 97.5 F (36.4 C)  TempSrc:   Oral Oral  Resp:   18 18  Height:      Weight:      SpO2:  97%  97%   Weight change:   Intake/Output Summary (Last 24 hours) at 10/29/11 1932 Last data filed at 10/29/11 1445  Gross per 24 hour  Intake  617.5 ml  Output    950 ml  Net -332.5 ml    General: Alert, awake, oriented x3, in no acute distress.  HEENT: [place]/AT PEERL, EOMI Neck: Trachea midline,  no masses, no thyromegal,y no JVD, no carotid bruit OROPHARYNX:  Moist, No exudate/ erythema/lesions.  Heart: Regular rate and rhythm, without murmurs, rubs, gallops, PMI non-displaced, no heaves or thrills on palpation.  Lungs: Clear to auscultation, no wheezing or rhonchi noted. No increased vocal fremitus resonant to percussion  Abdomen: Soft, nontender, nondistended, positive bowel sounds, no masses no hepatosplenomegaly noted..  Neuro: No focal neurological deficits noted cranial nerves II through XII grossly intact. DTRs 2+ bilaterally upper and lower extremities. Strength 5 out of 5 in bilateral upper and lower extremities. Musculoskeletal: No warm swelling or erythema around joints, no spinal tenderness noted. Psychiatric: Patient alert and oriented x3, good insight and cognition, good recent to remote recall. Lymph node survey: No cervical axillary or inguinal lymphadenopathy noted.     Lab Results:  [facility] 10/29/11 0556 10/28/11  0849  NA 142 139  K 3.4* 3.4*  CL 108 103  CO2 23 27  GLUCOSE 86 90  BUN 20 21  CREATININE 0.94 1.19*  CALCIUM 8.6 9.4  MG -- --  PHOS -- --    Basename 10/28/11 0849  AST 10  ALT 6  ALKPHOS 59  BILITOT 0.7  PROT 5.6*  ALBUMIN 3.0*   No results found for this basename: LIPASE:2,AMYLASE:2 in the last 72 hours  Basename 10/29/11 0556 10/28/11 0849  WBC 8.6 10.4  NEUTROABS -- 7.5  HGB 12.9 13.8  HCT 38.6 39.4  MCV 95.3 94.5  PLT 216 232   No results found for this basename: CKTOTAL:3,CKMB:3,CKMBINDEX:3,TROPONINI:3 in the last 72 hours No results found for this basename: POCBNP:3 in the last 72 hours No results found for this basename: DDIMER:2 in the last 72 hours No results found for this basename: HGBA1C:2 in the last 72 hours No results found for this basename: CHOL:2,HDL:2,LDLCALC:2,TRIG:2,CHOLHDL:2,LDLDIRECT:2 in the last 72 hours  Basename 10/28/11 1249  TSH 0.847  T4TOTAL --  T3FREE --  THYROIDAB --   No results found for this basename: VITAMINB12:2,FOLATE:2,FERRITIN:2,TIBC:2,IRON:2,RETICCTPCT:2 in the last 72 hours  Micro Results: Recent Results (from the past 240 hour(s))  URINE CULTURE     Status: Normal   Collection Time   10/28/11  9:14 AM      Component Value Range Status Comment   Specimen Description URINE, CATHETERIZED   Final    Special Requests NONE   Final  Setup Time [id number]   Final    Colony Count NO GROWTH   Final    Culture NO GROWTH   Final    Report Status 10/29/2011 FINAL   Final     Studies/Results: Dg Chest Portable 1 View  10/28/2011  *RADIOLOGY REPORT*  Clinical Data: Weakness.  Shortness of breath and hypertension  PORTABLE CHEST - 1 VIEW  Comparison: 09/02/2011  Findings:  There is a left chest wall pacer device with lead in the right atrial appendage and right ventricle.  The heart size appears normal.  There is no pleural effusion or pulmonary edema.  No airspace consolidation.  Low lung volumes noted.  IMPRESSION:  1.   No acute cardiopulmonary abnormalities  Original Report Authenticated By: Rosealee Albee, M.D.    Medications: I have reviewed the patient's current medications. Scheduled Meds:   . sodium chloride   Intravenous Once  . amLODipine  5 mg Oral Daily  . carbidopa-levodopa  1 tablet Oral QID  . folic acid  1 mg Oral Daily  . levothyroxine  100 mcg Oral Daily  .  morphine injection  1 mg Intravenous NOW  . olmesartan  10 mg Oral Daily  . propranolol  80 mg Oral Daily  . sertraline  100 mg Oral Daily  . warfarin  1 mg Oral Custom  . warfarin  2 mg Oral Custom  . DISCONTD:  morphine injection  2 mg Intravenous NOW  . DISCONTD: warfarin  2 mg Oral Custom   Continuous Infusions:   . 0.9 % NaCl with KCl 20 mEq / L 50 mL/hr at 10/29/11 0645   PRN Meds:.acetaminophen, bisacodyl, hydrALAZINE, polyethylene glycol, sodium phosphate Assessment/Plan: Patient Active Hospital Problem List: HYPOTHYROIDISM (08/29/2008)   Assessment: TSH found to be normal at 0.847. We'll continue dose of Synthroid     PARKINSON'S DISEASE (04/03/2009)   Assessment: Continue Sinemet. Recommended physical therapy for rehabilitation. Social work to proceed with plans for discharge to SNF    HYPERTENSION (08/29/2008)   Assessment: Blood pressure much better controlled today.    Sinoatrial node dysfunction (04/25/2009)   Assessment: Vision has pacemaker.     Generalized weakness (10/28/2011)   Assessment: For SNF placement for rehabilitation.     Gait disorder (10/28/2011)   Assessment: See physical therapy recommendations   F/E/N: Continue patient's IV fluids at [facility] and encourage by mouth intake. We'll evaluate the patient's oral intake over the next 24 hours and make decisions beyond that.       LOS: 1 day

## 2011-10-29 NOTE — Progress Notes (Signed)
ANTICOAGULATION CONSULT NOTE - Follow Up Consult  Pharmacy Consult for Coumadin Indication: atrial fibrillation  Allergies  Allergen Reactions  . Valium Other (See Comments)    "loopy"  . Amlodipine Besy-Benazepril Hcl Swelling and Rash  . Amoxicillin Swelling and Rash  . Cephalexin Swelling and Rash  . Codeine Swelling and Rash  . Codeine Phosphate Swelling and Rash  . Keflex Swelling and Rash  . Labetalol Hcl Swelling and Rash  . Levofloxacin Swelling and Rash  . Niacin Swelling and Rash  . Penicillins Swelling and Rash  . Terazosin Hcl Swelling and Rash    Patient Measurements:   Ht:  61 inches Wt: 65 kg  Vital Signs: Temp: 96.9 F (36.1 C) (12/04 0600) Temp src: Oral (12/04 0600) BP: 185/67 mmHg (12/04 0600) Pulse Rate: 62  (12/04 0600)  Labs:  Basename 10/29/11 0556 10/28/11 0849  HGB 12.9 13.8  HCT 38.6 39.4  PLT 216 232  APTT -- 45*  LABPROT 31.9* 27.0*  INR 3.03* 2.45*  HEPARINUNFRC -- --  CREATININE 0.94 1.19*  CKTOTAL -- --  CKMB -- --  TROPONINI -- --   The CrCl is unknown because both a height and weight (above a minimum accepted value) are required for this calculation.   Medications:  Scheduled:    . sodium chloride   Intravenous Once  . amLODipine  5 mg Oral Daily  . carbidopa-levodopa  1 tablet Oral QID  . folic acid  1 mg Oral Daily  . levothyroxine  100 mcg Oral Daily  .  morphine injection  1 mg Intravenous NOW  . olmesartan  10 mg Oral Daily  . propranolol  80 mg Oral Daily  . sertraline  100 mg Oral Daily  . warfarin  1 mg Oral Custom  . warfarin  2 mg Oral Custom  . DISCONTD: carbidopa-levodopa  1 tablet Oral NOW  . DISCONTD: enoxaparin  30 mg Subcutaneous Q24H  . DISCONTD: folic acid  400 mcg Oral Daily  . DISCONTD: hydrALAZINE  5 mg Intravenous NOW  . DISCONTD: hydrALAZINE  5 mg Intravenous Once  . DISCONTD:  morphine injection  2 mg Intravenous NOW  . DISCONTD: warfarin  1 mg Oral Custom  . DISCONTD: warfarin  2 mg Oral  Custom   Infusions:    . 0.9 % NaCl with KCl 20 mEq / L 50 mL/hr at 10/29/11 1610    Assessment: 77 y.o. female admitted on 10/28/2011, with a complex medical history including atrial fibrillation  Goal of Therapy:  INR 2 - 3   Plan:  Hold Coumadin today, resume Home schedule tomorrow.  Ronelle Smallman, Elisha Headland, Pharm.D. 10/29/2011 10:26 AM

## 2011-10-29 NOTE — Progress Notes (Addendum)
Speech Language/Pathology Clinical/Bedside Swallow Evaluation Patient Details  Name: Colleen Morris MRN: [id number] DOB: [birth year] Today's Date: 10/29/2011  Past Medical History:  Past Medical History  Diagnosis Date  . CAD (coronary artery disease)   . Atrial fibrillation   . Hypertension   . Hyperlipidemia   . Depression   . Anxiety   . AV block   . Parkinson's disease   . Hypothyroid   . Orthostatic hypotension   . Renal insufficiency   . Sinoatrial node dysfunction   . Dizziness   . Hypercholesteremia   . Cough   . Sinusitis   . Parkinson disease   . Low back pain syndrome   . Femoral bruit   . Sleep apnea   . Diverticulitis, colon   . Anemia    Past Surgical History:  Past Surgical History  Procedure Date  . Pacemaker insertion   . Coronary artery bypass graft   . Cataract extraction   . Polypectomy   . Right l4-5 far-lateral metrix microdiscectomy     Clinical Impression: Pt presents with a functional swallow with no indication of compromised airway protection and mildly decreased mastication.  No SLP f/u warranted.  Recommendations Solid Consistency: Regular Liquid Consistency: Thin Liquid Administration via: Cup;Straw Medication Administration: Whole meds with liquid Supervision:  assistance needed for feeding     [name] L. Samson Frederic, MA CCC/SLP Pager [phone]   Blenda Mounts [name] 10/29/2011,11:28 AM

## 2011-10-30 ENCOUNTER — Encounter: Payer: Medicare Other | Admitting: *Deleted

## 2011-10-30 LAB — BASIC METABOLIC PANEL
CO2: 20 mEq/L (ref 19–32)
Calcium: 8.4 mg/dL (ref 8.4–10.5)
GFR calc Af Amer: 73 mL/min — ABNORMAL LOW (ref >90)
Sodium: 141 mEq/L (ref 135–145)

## 2011-10-30 MED ORDER — ONDANSETRON HCL 4 MG/2ML IJ SOLN
4.0000 mg | Freq: Four times a day (QID) | INTRAMUSCULAR | Status: DC | PRN
  Administered 2011-10-30 – 2011-11-03 (×2): 4 mg via INTRAVENOUS
  Filled 2011-10-30 (×2): qty 2

## 2011-10-30 MED ORDER — MIRTAZAPINE 15 MG PO TBDP
15.0000 mg | ORAL_TABLET | Freq: Every day | ORAL | Status: DC
  Administered 2011-10-30 – 2011-11-03 (×5): 15 mg via ORAL
  Filled 2011-10-30 (×6): qty 1

## 2011-10-30 MED ORDER — HYDRALAZINE HCL 20 MG/ML IJ SOLN
5.0000 mg | Freq: Four times a day (QID) | INTRAMUSCULAR | Status: DC | PRN
  Administered 2011-10-30 – 2011-11-03 (×4): 5 mg via INTRAVENOUS
  Filled 2011-10-30 (×3): qty 0.25

## 2011-10-30 MED ORDER — CLONAZEPAM 0.5 MG PO TABS
0.2500 mg | ORAL_TABLET | Freq: Two times a day (BID) | ORAL | Status: DC | PRN
  Administered 2011-10-31: 0.25 mg via ORAL
  Filled 2011-10-30: qty 1

## 2011-10-30 MED ORDER — AMLODIPINE BESYLATE 10 MG PO TABS
10.0000 mg | ORAL_TABLET | Freq: Every day | ORAL | Status: DC
  Administered 2011-10-31 – 2011-11-05 (×6): 10 mg via ORAL
  Filled 2011-10-30 (×7): qty 1

## 2011-10-30 MED ORDER — ENSURE CLINICAL ST REVIGOR PO LIQD
237.0000 mL | Freq: Three times a day (TID) | ORAL | Status: DC
  Administered 2011-10-31 – 2011-11-05 (×10): 237 mL via ORAL

## 2011-10-30 NOTE — Progress Notes (Signed)
ANTICOAGULATION CONSULT NOTE - Follow Up Consult  Pharmacy Consult for : Coumadin Indication: atrial fibrillation  Allergies  Allergen Reactions  . Valium Other (See Comments)    "loopy"  . Amlodipine Besy-Benazepril Hcl Swelling and Rash  . Amoxicillin Swelling and Rash  . Cephalexin Swelling and Rash  . Codeine Swelling and Rash  . Codeine Phosphate Swelling and Rash  . Keflex Swelling and Rash  . Labetalol Hcl Swelling and Rash  . Levofloxacin Swelling and Rash  . Niacin Swelling and Rash  . Penicillins Swelling and Rash  . Terazosin Hcl Swelling and Rash    Patient Measurements: Height: 5\' 1"  (154.9 cm) Weight: 143 lb 4.8 oz (65 kg) IBW/kg (Calculated) : 47.8    Vital Signs: Temp: 96.8 F (36 C) (12/05 0500) Temp src: Oral (12/05 0500) BP: 199/75 mmHg (12/05 1023) Pulse Rate: 62  (12/05 1029)  Labs:  Basename 10/30/11 0605 10/29/11 0556 10/28/11 0849  HGB -- 12.9 13.8  HCT -- 38.6 39.4  PLT -- 216 232  APTT -- -- 45*  LABPROT 36.9* 31.9* 27.0*  INR 3.66* 3.03* 2.45*  HEPARINUNFRC -- -- --  CREATININE 0.84 0.94 1.19*  CKTOTAL -- -- --  CKMB -- -- --  TROPONINI -- -- --   Estimated Creatinine Clearance: 44.6 ml/min (by C-G formula based on Cr of 0.84).   Medications:  Scheduled:    . sodium chloride   Intravenous Once  . amLODipine  5 mg Oral Daily  . carbidopa-levodopa  1 tablet Oral QID  . folic acid  1 mg Oral Daily  . levothyroxine  100 mcg Oral Daily  . olmesartan  10 mg Oral Daily  . potassium chloride  40 mEq Oral Q2H  . propranolol  80 mg Oral Daily  . sertraline  100 mg Oral Daily  . warfarin  1 mg Oral Custom  . warfarin  2 mg Oral Custom   Infusions:    . 0.9 % NaCl with KCl 20 mEq / L 50 mL/hr at 10/29/11 1932    Assessment: Patient is a 77 y.o. female admitted on 10/28/2011, with a complex medical history including atrial fibrillation requiring chronic Coumadin for VTE prophylaxis.   INR rising  3.66 without additional Coumadin  being given. Patient is NOT eating and is possable cause for increasing sensitivity to Coumadin.  Goal of Therapy:  INR 2-3   Plan:  Hold Coumadin for now. Continue daily monitoring.  Brynda Heick, Elisha Headland, Pharm.D. 10/30/2011 11:24 AM

## 2011-10-30 NOTE — Progress Notes (Signed)
Occupational Therapy Evaluation Patient Details Name: Colleen Morris MRN: [id number] DOB: 31-Jan-1927 Today's Date: 10/30/2011  Problem List:  Patient Active Problem List  Diagnoses  . HYPOTHYROIDISM  . HYPERCHOLESTEROLEMIA  IIA  . HYPERLIPIDEMIA  . ANEMIA-NOS  . ANXIETY  . DEPRESSION  . PARKINSON'S DISEASE  . HYPERTENSION  . CORONARY ARTERY DISEASE  . AV BLOCK, COMPLETE  . ATRIAL FIBRILLATION  . Sinoatrial node dysfunction  . HYPOTENSION, ORTHOSTATIC  . SINUSITIS - ACUTE-NOS  . DIVERTICULOSIS, COLON  . RENAL INSUFFICIENCY  . LOW BACK PAIN SYNDROME  . DIZZINESS  . SLEEP APNEA  . Other Symptoms Involving Cardiovascular System  . COUGH  . POLYPECTOMY, HX OF  . CATARACT EXTRACTION, HX OF  . CORONARY ARTERY BYPASS GRAFT, HX OF  . Cardiac pacemaker- [employer]  . Aortic stenosis  . Swelling of joint, hand, left  . Generalized weakness  . Gait disorder    Past Medical History:  Past Medical History  Diagnosis Date  . CAD (coronary artery disease)   . Atrial fibrillation   . Hypertension   . Hyperlipidemia   . Depression   . Anxiety   . AV block   . Parkinson's disease   . Hypothyroid   . Orthostatic hypotension   . Renal insufficiency   . Sinoatrial node dysfunction   . Dizziness   . Hypercholesteremia   . Cough   . Sinusitis   . Parkinson disease   . Low back pain syndrome   . Femoral bruit   . Sleep apnea   . Diverticulitis, colon   . Anemia    Past Surgical History:  Past Surgical History  Procedure Date  . Pacemaker insertion   . Coronary artery bypass graft   . Cataract extraction   . Polypectomy   . Right l4-5 far-lateral metrix microdiscectomy     OT Assessment/Plan/Recommendation OT Assessment Clinical Impression Statement: This 77 y.o. female presents to OT with generalized weakness, decreased Lt. UE function.  Recommend OT maximize safety and independence with BADLs, and improve Lt. UE function OT Recommendation/Assessment:  Patient will need skilled OT in the acute care venue OT Problem List: Decreased strength;Decreased range of motion;Decreased activity tolerance;Impaired balance (sitting and/or standing);Decreased coordination;Decreased knowledge of use of DME or AE;Cardiopulmonary status limiting activity;Impaired sensation;Impaired UE functional use Barriers to Discharge: None OT Therapy Diagnosis : Generalized weakness;Cognitive deficits OT Plan OT Frequency: Min 2X/week OT Treatment/Interventions: Self-care/ADL training;Therapeutic exercise;Neuromuscular education;DME and/or AE instruction;Manual therapy;Therapeutic activities;Cognitive remediation/compensation;Patient/family education;Balance training;Other (comment) (splinting) OT Recommendation Follow Up Recommendations: Skilled nursing facility Equipment Recommended: Defer to next venue Individuals Consulted Consulted and Agree with Results and Recommendations: Patient;Family member/caregiver OT Goals Acute Rehab OT Goals OT Goal Formulation: With patient/family Time For Goal Achievement: 2 weeks ADL Goals Pt Will Perform Upper Body Bathing: with set-up;Sitting, chair Pt Will Perform Lower Body Bathing: with mod assist;Sit to stand from chair;Sit to stand from bed Pt Will Transfer to Toilet: with min assist;3-in-1;Stand pivot transfer Pt Will Perform Toileting - Hygiene: with min assist;Standing at 3-in-1/toilet Additional ADL Goal #1: Pt/caregiver will be independent with splint wear and care Additional ADL Goal #2: Pt./caregiver will be independent with HEP for Lt. UE  OT Evaluation Precautions/Restrictions  Precautions Precautions: Fall Restrictions Weight Bearing Restrictions: No Prior Functioning Home Living Lives With: Daughter Receives Help From: Family Additional Comments: Plan is for SNF level rehab at D/C Prior Function Level of Independence:  (Pt. was independent until ~6 wks ago) Comments: Pt's dtr reports that pt. was  independent until ~6 weeks PTA when she was hospitalized ADL ADL Eating/Feeding: Simulated;Minimal assistance Where Assessed - Eating/Feeding: Bed level Grooming: Simulated;Wash/dry face;Wash/dry hands;Minimal assistance Where Assessed - Grooming: Supine, head of bed up Upper Body Bathing: Simulated;Minimal assistance Where Assessed - Upper Body Bathing: Supine, head of bed up Lower Body Bathing: Simulated;Moderate assistance Where Assessed - Lower Body Bathing: Supine, head of bed up;Supine, head of bed flat;Rolling right and/or left Upper Body Dressing: Simulated;Moderate assistance Where Assessed - Upper Body Dressing: Supine, head of bed up Lower Body Dressing: Simulated;Maximal assistance Where Assessed - Lower Body Dressing: Supine, head of bed up;Rolling right and/or left Toilet Transfer: Not assessed ADL Comments: Assesed Lt. UE and spoke with daughter and pt. re: treatment suggestions.  BP taken by RN prior to moving pt. OOB, and BP 199/75.  Further activity deferred due to elevated BP and pt. very confused and with c/o nausea   Checked badk at ~1250 and BP still elevated. Vision/Perception  Vision - Assessment Vision Assessment: Vision not tested Cognition Cognition Arousal/Alertness: Awake/alert Orientation Level: Oriented to person;Oriented to situation (intermittent confusion.  ) Sensation/Coordination Sensation Light Touch: Impaired Detail (Lt. hand and forearm) Light Touch Impaired Details: Impaired LUE Stereognosis: Not tested Hot/Cold: Not tested Proprioception: Not tested Coordination Gross Motor Movements are Fluid and Coordinated: No Fine Motor Movements are Fluid and Coordinated: No Coordination and Movement Description: Lt. UE with claw hand and shoulder AROM ~80 elevation Extremity Assessment RUE Assessment RUE Assessment: Within Functional Limits LUE Assessment LUE Assessment: Exceptions to WFL LUE AROM (degrees) Overall AROM Left Upper Extremity:  Deficits LUE Overall AROM Comments: Pt. and dtr report progressive decline in ROM and function of Lt. UE/hand.  Pt. demonstrates claw hand.  She demonstrates ~50% gross grasp, and ~25 active extension of PIPs when MCPs blocked.  ~20 thumb flex/ext noted.  Spoke with pt. and dtr re: recommendations for resting hand splint and possible anti -claw splint to prevent deformity and improve functional use of Lt. hand.  Pt. with thin/fragile skin, so will continue to asses needs and appropriatenss of splinting Mobility  Bed Mobility Bed Mobility: No Transfers Transfers: No Exercises   End of Session OT - End of Session Equipment Utilized During Treatment:  (none) Activity Tolerance: Treatment limited secondary to medical complications (Comment) Patient left: in bed General Cognition: Impaired   Zyionna Pesce M 10/30/2011, 3:03 PM

## 2011-10-30 NOTE — Plan of Care (Signed)
Problem: Phase I Progression Outcomes Goal: OOB as tolerated unless otherwise ordered Outcome: Not Progressing Patient with consistently high blood pressures. PT unable to work with patient because of increased BP.

## 2011-10-30 NOTE — Progress Notes (Addendum)
Subjective: Patient no complaining of shortness of breath, chest pain or abdominal pain. Flat affect with crying spells. The patient is still no eating properly and really weak to participate in a full physical therapy session.  Objective: Vital signs in last 24 hours: Temp:  [96.8 F (36 C)-97.5 F (36.4 C)] 97.1 F (36.2 C) (12/05 1451) Pulse Rate:  [60-76] 76  (12/05 1451) Resp:  [18] 18  (12/05 1451) BP: (126-199)/(56-77) 126/56 mmHg (12/05 1451) SpO2:  [96 %-97 %] 97 % (12/05 1451) Weight change:  Last BM Date: 10/27/11  Intake/Output from previous day: 12/04 0701 - 12/05 0700 In: 35 [P.O.:35] Out: 1200 [Urine:1200] Total I/O In: 2622 [I.V.:2622] Out: -    Physical Exam: General: Alert, awake, oriented x3, in mild distress and anxious. HEENT: No bruits, no goiter. Heart: Regular rate and rhythm, positive murmur, no rubs or gallops. Lungs: Clear to auscultation bilaterally. Abdomen: Soft, nontender, nondistended, positive bowel sounds. Extremities: No clubbing cyanosis or edema with positive pedal pulses. Neuro: Grossly intact, LUE weakness on 3/5 and MS 3-4/5 in the rest of her extremities.   Lab Results: Basic Metabolic Panel:  Basename 10/30/11 0605 10/29/11 0556  NA 141 142  K 3.7 3.4*  CL 108 108  CO2 20 23  GLUCOSE 86 86  BUN 17 20  CREATININE 0.84 0.94  CALCIUM 8.4 8.6  MG -- --  PHOS -- --   Liver Function Tests:  [facility] 10/28/11 0849  AST 10  ALT 6  ALKPHOS 59  BILITOT 0.7  PROT 5.6*  ALBUMIN 3.0*   CBC:  Basename 10/29/11 0556 10/28/11 0849  WBC 8.6 10.4  NEUTROABS -- 7.5  HGB 12.9 13.8  HCT 38.6 39.4  MCV 95.3 94.5  PLT 216 232   Thyroid Function Tests:  Basename 10/28/11 1249  TSH 0.847  T4TOTAL --  FREET4 --  T3FREE --  THYROIDAB --   Coagulation:  Basename 10/30/11 0605 10/29/11 0556  LABPROT 36.9* 31.9*  INR 3.66* 3.03*    Misc. Labs:  Recent Results (from the past 240 hour(s))  URINE CULTURE     Status:  Normal   Collection Time   10/28/11  9:14 AM      Component Value Range Status Comment   Specimen Description URINE, CATHETERIZED   Final    Special Requests NONE   Final    Setup Time [id number]   Final    Colony Count NO GROWTH   Final    Culture NO GROWTH   Final    Report Status 10/29/2011 FINAL   Final     Studies/Results: No results found.  Medications: Scheduled Meds:   . sodium chloride   Intravenous Once  . amLODipine  5 mg Oral Daily  . carbidopa-levodopa  1 tablet Oral QID  . feeding supplement  237 mL Oral TID WC  . folic acid  1 mg Oral Daily  . levothyroxine  100 mcg Oral Daily  . mirtazapine  15 mg Oral QHS  . olmesartan  10 mg Oral Daily  . potassium chloride  40 mEq Oral Q2H  . propranolol  80 mg Oral Daily  . sertraline  100 mg Oral Daily  . DISCONTD: warfarin  1 mg Oral Custom  . DISCONTD: warfarin  2 mg Oral Custom   Continuous Infusions:    . 0.9 % NaCl with KCl 20 mEq / L 50 mL/hr at 10/30/11 1458   PRN Meds:.acetaminophen, bisacodyl, clonazePAM, hydrALAZINE, ondansetron, polyethylene glycol, DISCONTD: hydrALAZINE  Assessment/Plan:  1-HYPOTHYROIDISM: Continue Levothyroxine 100 mcg daily.  2-PARKINSON'S DISEASE: Continue Sinemet, continue supportive care. Patient to continue working with physical therapy/occupational therapy.  3-HYPERTENSION: Still with some difficulties with her blood pressure, will adjust amlodipine to 10 mg by mouth daily. Continue hydralazine PRN.  4-hypokalemia: Resolved.  5-Sinoatrial node dysfunction: s/p pacemaker.   6-Generalized weakness: per PT/OT recommendations are for SNF.  7-Depression:continue sertraline, will also start remeron.  8-anxiety: Start PRN Klonopin.  9-A.fib: continue coumadin per pharmacy. INR mildly supra-therapeutic at this point.    LOS: 2 days   Colleen Morris 10/30/2011, 6:51 PM

## 2011-10-31 LAB — GLUCOSE, CAPILLARY: Glucose-Capillary: 75 mg/dL (ref 70–99)

## 2011-10-31 LAB — BASIC METABOLIC PANEL
Calcium: 8.3 mg/dL — ABNORMAL LOW (ref 8.4–10.5)
Chloride: 113 mEq/L — ABNORMAL HIGH (ref 96–112)
Creatinine, Ser: 1.01 mg/dL (ref 0.50–1.10)
GFR calc Af Amer: 58 mL/min — ABNORMAL LOW (ref >90)
GFR calc non Af Amer: 50 mL/min — ABNORMAL LOW (ref >90)

## 2011-10-31 LAB — PROTIME-INR
INR: 3.91 — ABNORMAL HIGH (ref 0.00–1.49)
Prothrombin Time: 38.9 seconds — ABNORMAL HIGH (ref 11.6–15.2)

## 2011-10-31 MED ORDER — ZOLPIDEM TARTRATE 5 MG PO TABS: 5.0000 mg | ORAL_TABLET | Freq: Every evening | ORAL | Status: DC | PRN

## 2011-10-31 MED ORDER — HALOPERIDOL LACTATE 2 MG/ML PO CONC
1.0000 mg | Freq: Two times a day (BID) | ORAL | Status: DC | PRN
  Administered 2011-10-31 – 2011-11-03 (×3): 1 mg via ORAL
  Filled 2011-10-31 (×4): qty 0.5

## 2011-10-31 NOTE — Progress Notes (Addendum)
PT Cancellation Note  Treatment cancelled today due to medical issues with patient which prohibited therapy: Elevated BP, RN made aware, pt unable to be aroused enough to participate with PT this morning secondary to sedation medication. Will attempt again as time allows. Thank you!  Colleen Morris (Colleen Morris) Carleene Mains PT, DPT Acute Rehabilitation [phone]

## 2011-10-31 NOTE — Progress Notes (Addendum)
Subjective: Patient lethargic throughout the day after receiving a dose of Klonopin around 1:30 AM. Despite this patient has been easily arouse, no complaining of any chest pain, more calm, and eating and drinking better.  Objective: Vital signs in last 24 hours: Temp:  [97.6 F (36.4 C)-98.4 F (36.9 C)] 98.4 F (36.9 C) (12/06 1342) Pulse Rate:  [59-68] 60  (12/06 1342) Resp:  [18] 18  (12/06 1342) BP: (104-197)/(49-79) 104/49 mmHg (12/06 1342) SpO2:  [95 %-97 %] 96 % (12/06 1342) Weight change:  Last BM Date: 10/27/11  Intake/Output from previous day: 12/05 0701 - 12/06 0700 In: 2652 [P.O.:30; I.V.:2622] Out: 875 [Urine:875] Total I/O In: -  Out: 450 [Urine:450]   Physical Exam: General: Alert, awake, oriented x3, in mild distress and anxious. HEENT: No bruits, no goiter. Heart: Regular rate and rhythm, positive murmur, no rubs or gallops. Lungs: Clear to auscultation bilaterally. Abdomen: Soft, nontender, nondistended, positive bowel sounds. Extremities: No clubbing cyanosis or edema with positive pedal pulses. Neuro: Grossly intact, LUE weakness on 3/5 and MS 3-4/5 in the rest of her extremities.  Lab Results: Basic Metabolic Panel:  Basename 10/31/11 0608 10/30/11 0605  NA 143 141  K 3.7 3.7  CL 113* 108  CO2 22 20  GLUCOSE 69* 86  BUN 18 17  CREATININE 1.01 0.84  CALCIUM 8.3* 8.4  MG -- --  PHOS -- --   CBC:  Basename 10/29/11 0556  WBC 8.6  NEUTROABS --  HGB 12.9  HCT 38.6  MCV 95.3  PLT 216   Thyroid Function Tests: No results found for this basename: TSH,T4TOTAL,FREET4,T3FREE,THYROIDAB in the last 72 hours Coagulation:  Basename 10/31/11 0608 10/30/11 0605  LABPROT 38.9* 36.9*  INR 3.91* 3.66*    Misc. Labs:  Recent Results (from the past 240 hour(s))  URINE CULTURE     Status: Normal   Collection Time   10/28/11  9:14 AM      Component Value Range Status Comment   Specimen Description URINE, CATHETERIZED   Final    Special Requests  NONE   Final    Setup Time [id number]   Final    Colony Count NO GROWTH   Final    Culture NO GROWTH   Final    Report Status 10/29/2011 FINAL   Final     Studies/Results: No results found.  Medications: Scheduled Meds:    . sodium chloride   Intravenous Once  . amLODipine  10 mg Oral Daily  . carbidopa-levodopa  1 tablet Oral QID  . feeding supplement  237 mL Oral TID WC  . folic acid  1 mg Oral Daily  . levothyroxine  100 mcg Oral Daily  . mirtazapine  15 mg Oral QHS  . olmesartan  10 mg Oral Daily  . propranolol  80 mg Oral Daily  . sertraline  100 mg Oral Daily  . DISCONTD: amLODipine  5 mg Oral Daily   Continuous Infusions:    . 0.9 % NaCl with KCl 20 mEq / L 1,000 mL (10/31/11 1102)   PRN Meds:.acetaminophen, bisacodyl, hydrALAZINE, ondansetron, polyethylene glycol, zolpidem, DISCONTD: clonazePAM  Assessment/Plan: 1-HYPOTHYROIDISM: Continue Levothyroxine 100 mcg daily.  2-PARKINSON'S DISEASE: Continue Sinemet, continue supportive care. Patient to continue working with physical therapy/occupational therapy.  3-HYPERTENSION: her blood pressure is much better today; continue amlodipine to 10 mg by mouth daily. Continue hydralazine PRN.  4-hypokalemia: Resolved.  5-Sinoatrial node dysfunction: s/p pacemaker.   6-Generalized weakness: per PT/OT recommendations are for SNF. At this  point we're waiting for bed availability.  7-Depression:continue sertraline and continue remeron.  8-anxiety: The patient very lethargic after the use of Klonopin. Will stop the medication for now she is no longer having severe anxiety.   9-A.fib: Coumadin per pharmacy. INR continue to be supra-therapeutic at this point. Continue holding Coumadin for now. Pro-time/INR in am  10-insomnia: Will start zolpidem 5 mg each bedtime as needed for insomnia.  11-DVT: patient on Coumadin.    LOS: 3 days   Lonni Dirden 10/31/2011, 6:21 PM

## 2011-10-31 NOTE — Progress Notes (Signed)
ANTICOAGULATION CONSULT NOTE - Follow Up Consult  Pharmacy Consult for Coumadin Indication: atrial fibrillation  Allergies  Allergen Reactions  . Valium Other (See Comments)    "loopy"  . Amlodipine Besy-Benazepril Hcl Swelling and Rash    Takes Amlodipine at home.  [name] Amoxicillin Swelling and Rash  . Cephalexin Swelling and Rash  . Codeine Swelling and Rash  . Codeine Phosphate Swelling and Rash  . Keflex Swelling and Rash  . Labetalol Hcl Swelling and Rash  . Levofloxacin Swelling and Rash  . Niacin Swelling and Rash  . Penicillins Swelling and Rash  . Terazosin Hcl Swelling and Rash     Vital Signs: Temp: 97.6 F (36.4 C) (12/06 0500) Temp src: Axillary (12/06 0500) BP: 170/66 mmHg (12/06 1100) Pulse Rate: 61  (12/06 1100)  Labs:  Basename 10/31/11 0608 10/30/11 0605 10/29/11 0556  HGB -- -- 12.9  HCT -- -- 38.6  PLT -- -- 216  APTT -- -- --  LABPROT 38.9* 36.9* 31.9*  INR 3.91* 3.66* 3.03*  HEPARINUNFRC -- -- --  CREATININE 1.01 0.84 0.94  CKTOTAL -- -- --  CKMB -- -- --  TROPONINI -- -- --   Estimated Creatinine Clearance: 37.1 ml/min (by C-G formula based on Cr of 1.01).   Assessment: Patient is a 77 y.o. Female with a complex medical history including atrial fibrillation requiring chronic Coumadin therapy. Coumadin has been on hold for the past 2 days due to supratherapeutic INR. INR is still trending up, which is likely due to patient is NOT eating which causes decreasing metabolism and increasing sensitivity to Coumadin.  Goal of Therapy:  INR 2-3   Plan:  Continue holding coumadin Monitor INR and cbc closely  Riki Rusk 10/31/2011,1:16 PM

## 2011-10-31 NOTE — Progress Notes (Signed)
PT Cancellation Note  Stopped by room again, per chart review pt is very supratherapeutic, now has very low BP, and continues with decreased alertness. Given these factors PT will hold until tomorrow to maximize PT benefit and decrease risk for falls. Thank you!   Colleen Morris (Beverely Pace) Carleene Mains PT, DPT Acute Rehabilitation [phone]

## 2011-11-01 MED ORDER — INFLUENZA VIRUS VACC SPLIT PF IM SUSP
0.5000 mL | INTRAMUSCULAR | Status: AC
  Administered 2011-11-03: 0.5 mL via INTRAMUSCULAR
  Filled 2011-11-01: qty 0.5

## 2011-11-01 MED ORDER — SERTRALINE HCL 50 MG PO TABS
150.0000 mg | ORAL_TABLET | Freq: Every day | ORAL | Status: DC
  Administered 2011-11-02 – 2011-11-05 (×4): 150 mg via ORAL
  Filled 2011-11-01 (×5): qty 1

## 2011-11-01 NOTE — Progress Notes (Signed)
   CARE MANAGEMENT NOTE 11/01/2011  Patient:  Colleen Morris, Colleen Morris   Account Number:  [id number]  Date Initiated:  11/01/2011  Documentation initiated by:  Onnie Boer  Subjective/Objective Assessment:   PT WAS ADMITTED WITH WEAKNESS AND AMS     Action/Plan:   PROGRESSION OF CARE AND DISCHARGE PLANNING   Anticipated DC Date:  11/05/2011   Anticipated DC Plan:  SKILLED NURSING FACILITY  In-house referral  Clinical Social Worker      DC Planning Services  CM consult      Choice offered to / List presented to:             Status of service:  In process, will continue to follow Medicare Important Message given?   (If response is "NO", the following Medicare IM given date fields will be blank) Date Medicare IM given:   Date Additional Medicare IM given:    Discharge Disposition:    Per UR Regulation:  Reviewed for med. necessity/level of care/duration of stay  Comments:  UR COMPLETED 11/01/2011 Onnie Boer, RN, BSN [id number] PT WAS ADMITTED WITH WEAKNESS AND AMS. DC PLAN IS FOR SNF. PT HAS BEEN STARTED.

## 2011-11-01 NOTE — Progress Notes (Signed)
ANTICOAGULATION CONSULT NOTE - Follow Up Consult  Pharmacy Consult for Coumadin Indication: Atrial fibrillation  Allergies  Allergen Reactions  . Valium Other (See Comments)    "loopy"  . Amlodipine Besy-Benazepril Hcl Swelling and Rash    Takes Amlodipine at home.  [name] Amoxicillin Swelling and Rash  . Cephalexin Swelling and Rash  . Codeine Swelling and Rash  . Codeine Phosphate Swelling and Rash  . Keflex Swelling and Rash  . Labetalol Hcl Swelling and Rash  . Levofloxacin Swelling and Rash  . Niacin Swelling and Rash  . Penicillins Swelling and Rash  . Terazosin Hcl Swelling and Rash    Patient Measurements: Height: 5\' 1"  (154.9 cm) Weight: 143 lb 4.8 oz (65 kg) IBW/kg (Calculated) : 47.8    Vital Signs: Temp: 98 F (36.7 C) (12/07 0615) Temp src: Oral (12/07 0615) BP: 192/84 mmHg (12/07 0948) Pulse Rate: 73  (12/07 0948)  Labs:  Alvira Philips 11/01/11 0640 10/31/11 0608 10/30/11 0605  HGB -- -- --  HCT -- -- --  PLT -- -- --  APTT -- -- --  LABPROT 34.2* 38.9* 36.9*  INR 3.32* 3.91* 3.66*  HEPARINUNFRC -- -- --  CREATININE -- 1.01 0.84  CKTOTAL -- -- --  CKMB -- -- --  TROPONINI -- -- --   Estimated Creatinine Clearance: 37.1 ml/min (by C-G formula based on Cr of 1.01).   Medications: Date: 11/14 12/03 12/04  12/05 12/06 12/07   Time: 1526 1529 0849 2321 0556 0605 0608 0640        PT   27.0  31.9 36.9 38.9 34.2   INR 3.0  2.45  3.03 3.66 3.91 3.32   aPTT   45         Platelet Count  254.0 232  216           Medications    warfarin Tabs(mg)    2           Scheduled:    . sodium chloride   Intravenous Once  . amLODipine  10 mg Oral Daily  . carbidopa-levodopa  1 tablet Oral QID  . feeding supplement  237 mL Oral TID WC  . folic acid  1 mg Oral Daily  . levothyroxine  100 mcg Oral Daily  . mirtazapine  15 mg Oral QHS  . olmesartan  10 mg Oral Daily  . propranolol  80 mg Oral Daily  . sertraline  100 mg Oral Daily   Infusions:    . 0.9 %  NaCl with KCl 20 mEq / L 50 mL/hr at 11/01/11 1610    Assessment: Patient is a 77 y.o. Female with a complex medical history including atrial fibrillation requiring chronic Coumadin therapy. She has not received any Coumadin for last 3 days, yet her INR remains supratherapeutic.  She has not been eating.  Goal of Therapy:  INR 2-3   Plan:  Continue to hold Coumadin today.  Varnell Orvis, Elisha Headland, Pharm.D. 11/01/2011 11:01 AM

## 2011-11-01 NOTE — Progress Notes (Signed)
This assessment done at 0755, charted at the incorrect time.

## 2011-11-01 NOTE — Progress Notes (Addendum)
Subjective: Overnight with mild agitation and some crying spells. No chest pain, no shortness of breath, no fever. Overall this morning doing much better; now she is eating, drinking and participating more with physical therapy/occupational therapy.  Objective: Vital signs in last 24 hours: Temp:  [97.5 F (36.4 C)-98 F (36.7 C)] 98 F (36.7 C) (12/07 0615) Pulse Rate:  [60-73] 60  (12/07 1300) Resp:  [18] 18  (12/07 0615) BP: (125-192)/(51-89) 125/51 mmHg (12/07 1300) SpO2:  [97 %-98 %] 98 % (12/07 1300) Weight change:  Last BM Date: 10/27/11  Intake/Output from previous day: 12/06 0701 - 12/07 0700 In: 90 [P.O.:90] Out: 1050 [Urine:1050] Total I/O In: -  Out: 400 [Urine:400]   Physical Exam: General: Alert, awake, oriented x3, NAD, more calm and with better interaction. HEENT: No bruits, no goiter. Heart: Regular rate and rhythm, positive murmur, no rubs or gallops. Lungs: Clear to auscultation bilaterally. Abdomen: Soft, nontender, nondistended, positive bowel sounds. Extremities: No clubbing cyanosis or edema with positive pedal pulses. Neuro: Grossly intact, LUE weakness on 3/5 and MS 3-4/5 in the rest of her extremities.  Lab Results: Basic Metabolic Panel:  [facility] 10/31/11 0608 10/30/11 0605  NA 143 141  K 3.7 3.7  CL 113* 108  CO2 22 20  GLUCOSE 69* 86  BUN 18 17  CREATININE 1.01 0.84  CALCIUM 8.3* 8.4  MG -- --  PHOS -- --   Coagulation:  Basename 11/01/11 0640 10/31/11 0608  LABPROT 34.2* 38.9*  INR 3.32* 3.91*    Misc. Labs:  Recent Results (from the past 240 hour(s))  URINE CULTURE     Status: Normal   Collection Time   10/28/11  9:14 AM      Component Value Range Status Comment   Specimen Description URINE, CATHETERIZED   Final    Special Requests NONE   Final    Setup Time [id number]   Final    Colony Count NO GROWTH   Final    Culture NO GROWTH   Final    Report Status 10/29/2011 FINAL   Final     Studies/Results: No results  found.  Medications: Scheduled Meds:    . amLODipine  10 mg Oral Daily  . carbidopa-levodopa  1 tablet Oral QID  . feeding supplement  237 mL Oral TID WC  . folic acid  1 mg Oral Daily  . levothyroxine  100 mcg Oral Daily  . mirtazapine  15 mg Oral QHS  . olmesartan  10 mg Oral Daily  . propranolol  80 mg Oral Daily  . sertraline  100 mg Oral Daily  . DISCONTD: sodium chloride   Intravenous Once   Continuous Infusions:    . 0.9 % NaCl with KCl 20 mEq / L 50 mL/hr at 11/01/11 0607   PRN Meds:.acetaminophen, bisacodyl, haloperidol, hydrALAZINE, ondansetron, polyethylene glycol, zolpidem, DISCONTD: clonazePAM  Assessment/Plan: 1-HYPOTHYROIDISM: Continue Levothyroxine 100 mcg daily.  2-PARKINSON'S DISEASE: Continue Sinemet, continue supportive care. Patient to continue working with physical therapy/occupational therapy.  3-HYPERTENSION: her blood pressure continue improving; continue current regimen.  4-Sinoatrial node dysfunction: s/p pacemaker.   5-Generalized weakness: per PT/OT recommendations are for SNF. At this point we're waiting for bed availability.  6-Depression:continue sertraline and continue remeron.  7-anxiety: Improve. Continue sertraline. For severe agitation will use Haldol as needed.   9-A.fib: Coumadin per pharmacy. INR continue to be mildly supra-therapeutic at this point. Continue holding Coumadin for now. Pro-time/INR in am  10-insomnia: Will start zolpidem 5 mg qhs PRN  for insomnia.  11-DVT: patient on Coumadin.    LOS: 4 days   Niani Mourer 11/01/2011, 5:05 PM

## 2011-11-01 NOTE — Progress Notes (Signed)
Physical Therapy Treatment Patient Details Name: Colleen Morris MRN: [id number] DOB: [birth year] Today's Date: 11/01/2011  PT Assessment/Plan  PT - Assessment/Plan Comments on Treatment Session: Pt progressing and able to contribute more during session per chart review. Pt's BP at start of treatment with pt supine was 165/71, HR = 62. In sitting BP = 174/76, HR = 60. Immediately post transfer BP = 143/73. Pt with no c/o headache and only mild c/o dizziness upon sitting.  PT Plan: Discharge plan remains appropriate PT Frequency: Min 2X/week Follow Up Recommendations: Skilled nursing facility Equipment Recommended: Defer to next venue PT Goals  Acute Rehab PT Goals PT Goal Formulation: With patient/family Time For Goal Achievement: 2 weeks Pt will go Supine/Side to Sit: with supervision PT Goal: Supine/Side to Sit - Progress: Progressing toward goal Pt will Sit at [facility] of Bed: with supervision;with no upper extremity support;3-5 min PT Goal: Sit at Edge Of Bed - Progress: Progressing toward goal Pt will go Sit to Stand: with min assist PT Goal: Sit to Stand - Progress: Progressing toward goal Pt will Ambulate: 51 - 150 feet;with min assist;with rolling walker PT Goal: Ambulate - Progress: Progressing toward goal  PT Treatment Precautions/Restrictions  Precautions Precautions: Fall Restrictions Weight Bearing Restrictions: No Mobility (including Balance) Bed Mobility Bed Mobility: Yes Rolling Left: 4: Min assist Rolling Left Details (indicate cue type and reason): Verbal and tactile cues for sequence, reaching with Rt. UE, bending Rt. LE for efficiency. Step by step cues needed.  Left Sidelying to Sit: 4: Min assist;3: Mod assist Left Sidelying to Sit Details (indicate cue type and reason): Verbal cues for sequence, mod assist to advance bil. LEs to EOB, min assist to bring trunk to upright position.  Sitting - Scoot to Edge of Bed: 3: Mod assist Sitting - Scoot to Edge of Bed  Details (indicate cue type and reason):  (Verbal cues, manual facilitation for bil. weightshifting) Transfers Transfers: Yes Sit to Stand: 1: +2 Total assist;From bed;From chair/3-in-1 (pt= 60%) Sit to Stand Details (indicate cue type and reason): Performed 3 x. Verbal cues and manual facilitation for improved hip extension and anterior weight shift in standing. PT to block bil. feet. With initial stand pt lifted Lt. LE and crossed over to Rt. LE, requiring a sitting rest break to reposition. Pt unable to contribute with Lt. UE.  Stand to Sit: 1: +2 Total assist (pt= 65%) Stand to Sit Details: Verbal cues to control descent Stand Pivot Transfers: 1: +2 Total assist;With armrests (pt= 40%) Stand Pivot Transfer Details (indicate cue type and reason): Verbal cues to weightshifting bil. with stepping, facilitation through pelvis. Step by step sequencing required.  Ambulation/Gait Ambulation/Gait: No  Posture/Postural Control Posture/Postural Control: Postural limitations Postural Limitations: Significant kyphosis, posterior lean at times Balance Balance Assessed: Yes Static Standing Balance Static Standing - Balance Support: Right upper extremity supported Static Standing - Level of Assistance: 1: +2 Total assist (pt = 60%) Static Standing - Comment/# of Minutes: ~5 minutes while being cleaned from BM. Practiced improved hip extension and posture with static balance.  Exercise  General Exercises - Lower Extremity Ankle Circles/Pumps: AROM;Strengthening;Both;15 reps;Supine Heel Slides: AROM;Strengthening;Both;15 reps;Supine (slight manual resistance) End of Session PT - End of Session Equipment Utilized During Treatment: Gait belt Activity Tolerance: Patient limited by fatigue (limited by fear of falling) Patient left: in chair;with call bell in reach;with family/visitor present Nurse Communication: Mobility status for transfers General Behavior During Session: [facility] for tasks  performed Cognition: Impaired, at baseline  Cheek,  Dahlia Client 11/01/2011, 11:50 AM  Sherie Don) Carleene Mains PT, DPT Acute Rehabilitation [phone]

## 2011-11-01 NOTE — Progress Notes (Signed)
Clinical Social Work Psychiatry  Assessment    Presenting Symptoms/Problems: Patient and family member at bedside reports over the last six months patient has declined in mobility and independence.  Patient reports she has been very tearful, depressed and upset that she cannot do daily tasks anymore due to her medical problems.  Patient reports she was very independent prior to hospitalization and struggling with asking for help and losing independence.     Psychiatric History: patient reports she has never been diagnosed with any mental health problems, but struggles with depression and is prescribed Zoloft 100mg  that she takes daily.  Reports the likes the medication and it really helps her.  Family and patient deny any type of current or previous counseling or any hospitalizations for mental health.       Family Collateral Information: Family at bedside is her "adopted" daughter Laney Potash who she likes with and takes care of daily.  Family reports prior to hospitalization, patient doing very well at home, depressed at time and struggling with adjusting to her Parkinson's and decreased strength.  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  Recently she had to stop volunteering because of medical problems and she has struggled.  Patient is also very involved with the church by helping a pastor and other members of the church.  Throughout the interview, patient became very tearful and upset because she was afraid people in church were angry with her or did not know the situation as to why she has missed so much church.  Laney Potash also reports patient has a son in prison who has a sentence of 12 year + in which patient struggles and becomes very say that she cannot see her son or talk to him before Christmas.  Reports son suffers from Bipolar Disorder and substance abuse, but is stabilized with medication and doing very well, but  patient reports she grieves over him daily.  Reports patient had two brothers, one who was murdered at a very young age and another who has also passed.  Patient currently lives with family relative at bedside, but due to medical problems, patient will have to go to ST SNF/rehab to get stronger before returning home.  All are agreeable to this plan and have chosen [facility] for nursing home.  Family reports some memory loss with short term and long term memory at times, but for the most part, patient is very sharp.   Reports no SI, plan or intent or history of SI, HI, or psychosis.                             Emotional Health/Current Symptoms:     Suicide/Self harm: None reported and no history.      Psychotic/Dissociative Symptoms: Family reports while she has been in the hospital she was given Klonopin in which caused her to have ?nightmares and confusion.  This has been stopped and family reports no prior history of AH or VH or nightmares.     Attention/Behavioral Symptoms: Patient very calm and cooperative, pleasant on approach and very sweet.  Patient tearful throughout the entire assessment, reporting she does not know how to live without helping other people and finding it difficult to accept help from another.  Mood is depressed and Affect is congruent.  Patient speech is slow and garbled and patient speaks very low.  Patient  logical through conversation, but fixated on her son being in prison, however easily re-directed.      Recent Loss/Stressor: New onset of left sided arm weakness and swelling causing limited mobility and loss of strength.  Current medical problems preventing her from being able to complete daily tasks independently.      Substance Abuse/Use: none reported.       Disposition/Interpretive Summary:  Patient is very tearful and depressed, presents with Adjustment Disorder and depressed mood.  Discussed and educated patient on outpatient support groups and  counseling and she was very interested and appreciative.  Overall plan remains the same for patient to transfer to SNF once medically cleared at hospital.  All agreeable for patient to continue rehab and therapy in hopes to return home with family.  Will await other recommendations from Psych MD.  Will continue to follow and assist with outpatient resources.  Ashley Jacobs, MSW LCSWA [phone]  1006am

## 2011-11-01 NOTE — Consults (Signed)
Patient Identification:  Colleen Morris Date of Evaluation:  11/01/2011   History of Present Illness: This is a 77 years old who Caucasian female who was admitted to [facility] who for multiple medical conditions and also chronic depression. Patient has been suffering with the recently increased symptoms of depression, sadness, tearful, isolated, withdrawn and recent decrease in her psychosocial functioning and physical deterioration of her body. Patient has stepdaughter next her bed who was provided the some of the history during this evaluation. Patient has she been stressed about regarding her son who is a 40 plus years, was incarcerated more than 8 years ago and the she used to go and see him. He son was diagnosed with bipolar disorder while in jail and receiving treatment and doing okay now. She was unable to keep up planned visit. Patient rated her depression was 7 out of Scale 1-10 for the depression. 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  Mental Status Examination/Evaluation: Patient has a stated mood of depression and dysphoric during this visit. she hsd a decrease psychomotor activity. She has linear and goal-directed thoughts and she has denied suicidal homicidal ideations. There is no evidence for psychotic symptoms and she has a fair insight judgment and impulse control.  Past Medical History:     Past Medical History  Diagnosis Date  . CAD (coronary artery disease)   . Atrial fibrillation   . Hypertension   . Hyperlipidemia   . Depression   . Anxiety   . AV block   . Parkinson's disease   . Hypothyroid   . Orthostatic hypotension   . Renal insufficiency   . Sinoatrial node dysfunction   . Dizziness   . Hypercholesteremia   . Cough   . Sinusitis   . Parkinson disease   . Low back pain syndrome   . Femoral bruit   . Sleep apnea   . Diverticulitis, colon   . Anemia        Past Surgical  History  Procedure Date  . Pacemaker insertion   . Coronary artery bypass graft   . Cataract extraction   . Polypectomy   . Right l4-5 far-lateral metrix microdiscectomy     Allergies:  Allergies  Allergen Reactions  . Valium Other (See Comments)    "loopy"  . Amlodipine Besy-Benazepril Hcl Swelling and Rash    Takes Amlodipine at home.  [name] Amoxicillin Swelling and Rash  . Cephalexin Swelling and Rash  . Codeine Swelling and Rash  . Codeine Phosphate Swelling and Rash  . Keflex Swelling and Rash  . Labetalol Hcl Swelling and Rash  . Levofloxacin Swelling and Rash  . Niacin Swelling and Rash  . Penicillins Swelling and Rash  . Terazosin Hcl Swelling and Rash    Current Medications:  Prior to Admission medications   Medication Sig Start Date End Date Taking? Authorizing Provider  amLODipine (NORVASC) 5 MG tablet Take 5 mg by mouth daily.  09/10/11  Yes Shawnie Pons, MD  Calcium Carbonate-Vit D-Min (CALCIUM 1200 PO) Take 1 tablet by mouth daily.     Yes Historical Provider, MD  carbidopa-levodopa (SINEMET) 25-100 MG per tablet Take 1 tablet by mouth 4 (four) times daily. Take 1 tablet 3 times daily and 1 tablet at bedtime. 0600  1200  1700  2100   Yes Historical Provider, MD  Coenzyme Q10 (CO Q 10) 100 MG CAPS Take 1 capsule by mouth  daily.     Yes Historical Provider, MD  Doxylamine Succinate, Sleep, (SLEEP AID PO) Take 1 tablet by mouth at bedtime as needed. To help sleep.    Yes Historical Provider, MD  folic acid (FOLVITE) 400 MCG tablet Take 400 mcg by mouth daily.    Yes Historical Provider, MD  levothyroxine (SYNTHROID, LEVOTHROID) 100 MCG tablet Take 100 mcg by mouth daily.     Yes Historical Provider, MD  Potassium Gluconate 595 MG TBCR Take by mouth daily.    Yes Historical Provider, MD  propranolol (INDERAL LA) 80 MG 24 hr capsule Take 80 mg by mouth daily.     Yes Historical Provider, MD  sertraline (ZOLOFT) 100 MG tablet Take 100 mg by mouth daily.     Yes  Historical Provider, MD  valsartan (DIOVAN) 80 MG tablet Take 1 tablet (80 mg total) by mouth daily. 07/16/11 07/15/12 Yes Grayling Congress Lowne, DO  warfarin (COUMADIN) 2 MG tablet Take 2 mg by mouth as directed. Sunday and Wednesday: 0.5 tablet (1mg )  All other days 1 tablet (2mg )  07/09/11  Yes Shawnie Pons, MD  nitroGLYCERIN (NITROSTAT) 0.4 MG SL tablet Place 0.4 mg under the tongue every 5 (five) minutes x 3 doses as needed. 1 tablet under tongue at onset of chest pain may repeat in 5 min for up to 3 doses.    Historical Provider, MD  ondansetron (ZOFRAN) 8 MG tablet Take 8 mg by mouth every 8 (eight) hours as needed. For nausea.     Historical Provider, MD    Social History:    reports that she has never smoked. She has never used smokeless tobacco. She reports that she does not drink alcohol or use illicit drugs.   Family History:    Family History  Problem Relation Age of Onset  . Heart disease Sister   . Heart failure Brother     CHF  . Emphysema Sister   . Osteoporosis Sister   . Alzheimer's disease Sister       DIAGNOSIS:   AXIS I  Maj. depressive disorder with a recent exacerbation   AXIS II  Deffered  AXIS III See medical notes.  AXIS IV  moderate   AXIS V  40-45      Recommendations: #1. Will increase her medication Zoloft 150 mg daily for better control of symptoms #2. Recommended no acute psychiatric hospitalization #3. The patient psychiatric consultation    Leata Mouse, MD

## 2011-11-01 NOTE — Progress Notes (Signed)
Clinical social worker aware of psych consult for patient. CSW spoke with snf regarding confusion and behaviors. Snf feels that once medication is stabilized patient will still be appropriate for facility. Snf will be contacting family to arrange time for admission paper work. Pt anticipated dc date of Monday 11/04/2011. CSW continuing to follow to assist with pt needs.   Catha Gosselin, Theresia Majors  [phone] .11/01/2011 10:18am

## 2011-11-02 LAB — BASIC METABOLIC PANEL
BUN: 15 mg/dL (ref 6–23)
CO2: 21 mEq/L (ref 19–32)
Chloride: 110 mEq/L (ref 96–112)
Creatinine, Ser: 0.88 mg/dL (ref 0.50–1.10)
Glucose, Bld: 103 mg/dL — ABNORMAL HIGH (ref 70–99)

## 2011-11-02 MED ORDER — WARFARIN SODIUM 3 MG PO TABS
3.0000 mg | ORAL_TABLET | Freq: Once | ORAL | Status: DC
  Filled 2011-11-02: qty 1

## 2011-11-02 NOTE — Consult Note (Signed)
ANTICOAGULATION CONSULT NOTE - Follow Up Consult  Pharmacy Consult for Coumadin Indication: atrial fibrillation and DVT  Allergies  Allergen Reactions  . Valium Other (See Comments)    "loopy"  . Amlodipine Besy-Benazepril Hcl Swelling and Rash    Takes Amlodipine at home.  [name] Amoxicillin Swelling and Rash  . Cephalexin Swelling and Rash  . Codeine Swelling and Rash  . Codeine Phosphate Swelling and Rash  . Keflex Swelling and Rash  . Labetalol Hcl Swelling and Rash  . Levofloxacin Swelling and Rash  . Niacin Swelling and Rash  . Penicillins Swelling and Rash  . Terazosin Hcl Swelling and Rash    Patient Measurements: Height: 5\' 1"  (154.9 cm) Weight: 143 lb 4.8 oz (65 kg) IBW/kg (Calculated) : 47.8    Vital Signs: BP: 170/73 mmHg (12/08 1016)  Labs:  Basename 11/02/11 0654 11/01/11 0640 10/31/11 0608  HGB -- -- --  HCT -- -- --  PLT -- -- --  APTT -- -- --  LABPROT 25.1* 34.2* 38.9*  INR 2.23* 3.32* 3.91*  HEPARINUNFRC -- -- --  CREATININE 0.88 -- 1.01  CKTOTAL -- -- --  CKMB -- -- --  TROPONINI -- -- --   Estimated Creatinine Clearance: 42.6 ml/min (by C-G formula based on Cr of 0.88).   Medications:  Scheduled:    . amLODipine  10 mg Oral Daily  . carbidopa-levodopa  1 tablet Oral QID  . feeding supplement  237 mL Oral TID WC  . folic acid  1 mg Oral Daily  . influenza  inactive virus vaccine  0.5 mL Intramuscular Tomorrow-1000  . levothyroxine  100 mcg Oral Daily  . mirtazapine  15 mg Oral QHS  . olmesartan  10 mg Oral Daily  . propranolol  80 mg Oral Daily  . sertraline  150 mg Oral Daily  . DISCONTD: sodium chloride   Intravenous Once  . DISCONTD: sertraline  100 mg Oral Daily   Infusions:    . 0.9 % NaCl with KCl 20 mEq / L 1,000 mL (11/01/11 1735)    Assessment: Patient is a 77 y.o. Female with a complex medical history including atrial fibrillation requiring chronic Coumadin therapy.  She has not received any Coumadin for last 4 days.   Her INR has now dropped dramatically  3.32 >> 2.23. She has increased her oral intake, including nutrition.   Goal of Therapy:  INR 2-3   Plan:  Restart Coumadin.  Will begin with Coumadin 3mg  x 1.  Chidubem Chaires, Elisha Headland, Pharm.D. 11/02/2011 1:08 PM

## 2011-11-02 NOTE — Progress Notes (Signed)
Pt had episode of confusion.  She was hallucinating, and suspicious.  She c/o that nurse was not letting her get out of bed.   She refused medication  after she asked for them.  She state she was being held again her wishes.  Her daughter was in the room and she feels that maybe that her mother didn't get all her medications but she had received all he medication.

## 2011-11-02 NOTE — Progress Notes (Signed)
Subjective: No chest pain, no shortness of breath, no fever. Patient still continued to experience sundowning and episodes of agitation/confusion.  Objective: Vital signs in last 24 hours: Temp:  [97.8 F (36.6 C)] 97.8 F (36.6 C) (12/07 2200) Pulse Rate:  [60-73] 60  (12/07 2200) Resp:  [20-25] 25  (12/08 0600) BP: (125-192)/(51-84) 179/74 mmHg (12/07 2200) SpO2:  [96 %-98 %] 96 % (12/07 2200) Weight change:  Last BM Date: 10/27/11  Intake/Output from previous day: 12/07 0701 - 12/08 0700 In: 200 [P.O.:200] Out: 400 [Urine:400]     Physical Exam: General: Confused , awake, oriented x1, NAD. HEENT: No bruits, no goiter. Heart: Regular rate and rhythm, positive murmur, no rubs or gallops. Lungs: Clear to auscultation bilaterally. Abdomen: Soft, nontender, nondistended, positive bowel sounds. Extremities: No clubbing cyanosis or edema with positive pedal pulses. Neuro: Grossly intact, LUE weakness on 3/5 and MS 3-4/5 in the rest of her extremities.  Lab Results: Basic Metabolic Panel:  Basename 11/02/11 0654 10/31/11 0608  NA 141 143  K 3.5 3.7  CL 110 113*  CO2 21 22  GLUCOSE 103* 69*  BUN 15 18  CREATININE 0.88 1.01  CALCIUM 9.2 8.3*  MG -- --  PHOS -- --   Coagulation:  Basename 11/02/11 0654 11/01/11 0640  LABPROT 25.1* 34.2*  INR 2.23* 3.32*    Misc. Labs:  Recent Results (from the past 240 hour(s))  URINE CULTURE     Status: Normal   Collection Time   10/28/11  9:14 AM      Component Value Range Status Comment   Specimen Description URINE, CATHETERIZED   Final    Special Requests NONE   Final    Setup Time [id number]   Final    Colony Count NO GROWTH   Final    Culture NO GROWTH   Final    Report Status 10/29/2011 FINAL   Final     Studies/Results: No results found.  Medications: Scheduled Meds:    . amLODipine  10 mg Oral Daily  . carbidopa-levodopa  1 tablet Oral QID  . feeding supplement  237 mL Oral TID WC  . folic acid  1 mg  Oral Daily  . influenza  inactive virus vaccine  0.5 mL Intramuscular Tomorrow-1000  . levothyroxine  100 mcg Oral Daily  . mirtazapine  15 mg Oral QHS  . olmesartan  10 mg Oral Daily  . propranolol  80 mg Oral Daily  . sertraline  150 mg Oral Daily  . DISCONTD: sodium chloride   Intravenous Once  . DISCONTD: sertraline  100 mg Oral Daily   Continuous Infusions:    . 0.9 % NaCl with KCl 20 mEq / L 1,000 mL (11/01/11 1735)   PRN Meds:.acetaminophen, bisacodyl, haloperidol, hydrALAZINE, ondansetron, polyethylene glycol, zolpidem  Assessment/Plan: 1-HYPOTHYROIDISM: Continue Levothyroxine.  2-PARKINSON'S DISEASE: Continue Sinemet, continue supportive care. Patient to continue working with physical therapy/occupational therapy.  3-HYPERTENSION: her blood pressure with systolic in the 170s range; continue current regimen; follow low sodium diet and continue hydralazine PRN.  4-Sinoatrial node dysfunction: s/p pacemaker placement. No further abnormalities on workup needed at this point.  5-Generalized weakness: per PT/OT recommendations are for SNF. At this point we're waiting for bed availability at SNF.  6-Depression:continue sertraline, dose adjusted to 150 mg per psychiatry recommendations; will also continue remeron.  7-anxiety: For severe agitation will use Haldol as needed.   9-A.fib: Coumadin per pharmacy. PT/INR in therapeutic range today.  10-insomnia: Continue zolpidem as needed.  11-DVT: patient on Coumadin.    LOS: 5 days   Eulogio Requena 11/02/2011, 9:23 AM

## 2011-11-03 MED ORDER — ALPRAZOLAM 0.25 MG PO TABS
0.2500 mg | ORAL_TABLET | Freq: Two times a day (BID) | ORAL | Status: DC | PRN
  Administered 2011-11-03: 0.25 mg via ORAL
  Filled 2011-11-03: qty 1

## 2011-11-03 MED ORDER — HALOPERIDOL LACTATE 5 MG/ML IJ SOLN: 5.0000 mg | Freq: Four times a day (QID) | INTRAMUSCULAR | Status: DC | PRN

## 2011-11-03 MED ORDER — OLMESARTAN MEDOXOMIL 20 MG PO TABS
20.0000 mg | ORAL_TABLET | Freq: Every day | ORAL | Status: DC
  Administered 2011-11-04 – 2011-11-05 (×2): 20 mg via ORAL
  Filled 2011-11-03 (×3): qty 1

## 2011-11-03 MED ORDER — WARFARIN SODIUM 5 MG IV SOLR
3.0000 mg | Freq: Once | INTRAVENOUS | Status: AC
  Administered 2011-11-03: 3 mg via INTRAVENOUS
  Filled 2011-11-03: qty 1.5

## 2011-11-03 NOTE — Progress Notes (Signed)
ANTICOAGULATION CONSULT NOTE - Follow Up Consult  Pharmacy Consult for Coumadin Indication: atrial fibrillation and DVT  Allergies  Allergen Reactions  . Valium Other (See Comments)    "loopy"  . Amlodipine Besy-Benazepril Hcl Swelling and Rash    Takes Amlodipine at home.  [name] Amoxicillin Swelling and Rash  . Cephalexin Swelling and Rash  . Codeine Swelling and Rash  . Codeine Phosphate Swelling and Rash  . Keflex Swelling and Rash  . Labetalol Hcl Swelling and Rash  . Levofloxacin Swelling and Rash  . Niacin Swelling and Rash  . Penicillins Swelling and Rash  . Terazosin Hcl Swelling and Rash    Patient Measurements: Height: 5\' 1"  (154.9 cm) Weight: 143 lb 4.8 oz (65 kg) IBW/kg (Calculated) : 47.8    Vital Signs: Temp: 96.6 F (35.9 C) (12/09 0419) Temp src: Axillary (12/09 0419) BP: 160/65 mmHg (12/09 1231) Pulse Rate: 70  (12/09 1231)  Labs:  Alvira Philips 11/03/11 0810 11/02/11 0654 11/01/11 0640  HGB -- -- --  HCT -- -- --  PLT -- -- --  APTT -- -- --  LABPROT 23.3* 25.1* 34.2*  INR 2.03* 2.23* 3.32*  HEPARINUNFRC -- -- --  CREATININE -- 0.88 --  CKTOTAL -- -- --  CKMB -- -- --  TROPONINI -- -- --   Estimated Creatinine Clearance: 42.6 ml/min (by C-G formula based on Cr of 0.88).   Medications:  Scheduled:    . amLODipine  10 mg Oral Daily  . carbidopa-levodopa  1 tablet Oral QID  . feeding supplement  237 mL Oral TID WC  . folic acid  1 mg Oral Daily  . influenza  inactive virus vaccine  0.5 mL Intramuscular Tomorrow-1000  . levothyroxine  100 mcg Oral Daily  . mirtazapine  15 mg Oral QHS  . olmesartan  10 mg Oral Daily  . propranolol  80 mg Oral Daily  . sertraline  150 mg Oral Daily  . warfarin  3 mg Intravenous Not Given  . warfarin  3 mg Oral NOT Given   Infusions:    . 0.9 % NaCl with KCl 20 mEq / L 1,000 mL (11/01/11 1735)    Assessment: Patient is a 77 y.o. Female with a complex medical history including atrial fibrillation requiring  chronic Coumadin therapy.  She has not received any Coumadin for last 5 days. Her INR has now dropped to 2.03.  The Nurse reports she is not taking po's, including meds, well.  Goal of Therapy:  INR 2-3   Plan:  Coumadin 3mg   IV today.  Leyani Gargus, Elisha Headland, Pharm.D. 11/03/2011 1:26 PM

## 2011-11-03 NOTE — Progress Notes (Signed)
Colleen Morris is a 77 y.o. female patient, pleasant retired Engineer, civil (consulting), admitted with weakness and agitation.  SUBJECTIVE Rested better today, but had rough night. Apparently hallucinated.   1. HTN (hypertension)   2. Weakness   3. Dehydration   4. Agitation     Past Medical History  Diagnosis Date  . CAD (coronary artery disease)   . Atrial fibrillation   . Hypertension   . Hyperlipidemia   . Depression   . Anxiety   . AV block   . Parkinson's disease   . Hypothyroid   . Orthostatic hypotension   . Renal insufficiency   . Sinoatrial node dysfunction   . Dizziness   . Hypercholesteremia   . Cough   . Sinusitis   . Parkinson disease   . Low back pain syndrome   . Femoral bruit   . Sleep apnea   . Diverticulitis, colon   . Anemia    Current Facility-Administered Medications  Medication Dose Route Frequency Provider Last Rate Last Dose  . 0.9 % NaCl with KCl 20 mEq/ L  infusion   Intravenous Continuous Vassie Loll, MD 20 mL/hr at 11/01/11 1735 1,000 mL at 11/01/11 1735  . acetaminophen (TYLENOL) tablet 650 mg  650 mg Oral Q6H PRN Michelle A. Matthews   650 mg at 11/02/11 2149  . ALPRAZolam (XANAX) tablet 0.25 mg  0.25 mg Oral BID PRN Janyth Riera      . amLODipine (NORVASC) tablet 10 mg  10 mg Oral Daily Vassie Loll, MD   10 mg at 11/03/11 1229  . bisacodyl (DULCOLAX) suppository 10 mg  10 mg Rectal Daily PRN Michelle A. Ashley Royalty      . carbidopa-levodopa (SINEMET) 25-100 MG per tablet 1 tablet  1 tablet Oral QID Michelle A. Matthews   1 tablet at 11/03/11 1743  . feeding supplement (ENSURE CLINICAL STRENGTH) liquid 237 mL  237 mL Oral TID WC Vassie Loll, MD   237 mL at 11/03/11 1752  . folic acid (FOLVITE) tablet 1 mg  1 mg Oral Daily Michelle A. Matthews   1 mg at 11/03/11 1229  . haloperidol (HALDOL) 2 MG/ML solution 1 mg  1 mg Oral Q12H PRN Vassie Loll, MD   1 mg at 11/03/11 0122  . haloperidol lactate (HALDOL) injection 5 mg  5 mg Intravenous Q6H PRN Mary A.  Lynch      . hydrALAZINE (APRESOLINE) injection 5 mg  5 mg Intravenous Q6H PRN Vassie Loll, MD   5 mg at 11/03/11 1108  . influenza  inactive virus vaccine (FLUZONE/FLUARIX) injection 0.5 mL  0.5 mL Intramuscular Tomorrow-1000 Vassie Loll, MD   0.5 mL at 11/03/11 1059  . levothyroxine (SYNTHROID, LEVOTHROID) tablet 100 mcg  100 mcg Oral Daily Michelle A. Matthews   100 mcg at 11/02/11 1016  . mirtazapine (REMERON SOL-TAB) disintegrating tablet 15 mg  15 mg Oral QHS Vassie Loll, MD   15 mg at 11/02/11 2149  . olmesartan (BENICAR) tablet 20 mg  20 mg Oral Daily Raynah Gomes      . ondansetron (ZOFRAN) injection 4 mg  4 mg Intravenous Q6H PRN Vassie Loll, MD   4 mg at 11/03/11 1407  . polyethylene glycol (MIRALAX / GLYCOLAX) packet 17 g  17 g Oral Daily PRN Michelle A. Ashley Royalty      . propranolol (INDERAL LA) 24 hr capsule 80 mg  80 mg Oral Daily Michelle A. Matthews   80 mg at 11/03/11 1344  . sertraline (ZOLOFT) tablet 150  mg  150 mg Oral Daily Nehemiah Settle, MD   150 mg at 11/03/11 1230  . warfarin (COUMADIN) injection 3 mg  3 mg Intravenous ONCE-1800 Laurena Bering, PHARMD   3 mg at 11/03/11 1744  . zolpidem (AMBIEN) tablet 5 mg  5 mg Oral QHS PRN Vassie Loll, MD      . DISCONTD: olmesartan (BENICAR) tablet 10 mg  10 mg Oral Daily Michelle A. Matthews   10 mg at 11/03/11 1000  . DISCONTD: warfarin (COUMADIN) tablet 3 mg  3 mg Oral ONCE-1800 Earle J Stramoski, PHARMD       Allergies  Allergen Reactions  . Valium Other (See Comments)    "loopy"  . Amlodipine Besy-Benazepril Hcl Swelling and Rash    Takes Amlodipine at home.  [name] Amoxicillin Swelling and Rash  . Cephalexin Swelling and Rash  . Codeine Swelling and Rash  . Codeine Phosphate Swelling and Rash  . Keflex Swelling and Rash  . Labetalol Hcl Swelling and Rash  . Levofloxacin Swelling and Rash  . Niacin Swelling and Rash  . Penicillins Swelling and Rash  . Terazosin Hcl Swelling and Rash   Active  Problems:  HYPOTHYROIDISM  PARKINSON'S DISEASE  HYPERTENSION  Sinoatrial node dysfunction  Generalized weakness  Gait disorder   Vital signs in last 24 hours: Temp:  [96.6 F (35.9 C)-97.8 F (36.6 C)] 97.2 F (36.2 C) (12/09 1500) Pulse Rate:  [60-70] 60  (12/09 1500) Resp:  [18-20] 20  (12/09 1500) BP: (111-200)/(56-83) 111/56 mmHg (12/09 1500) SpO2:  [95 %-96 %] 95 % (12/09 1500) Weight change:  Last BM Date: 11/02/11  Intake/Output from previous day: 12/08 0701 - 12/09 0700 In: 533 [P.O.:270; I.V.:263] Out: 1675 [Urine:1675] Intake/Output this shift: Total I/O In: 268 [I.V.:268] Out: -   Lab Results: No results found for this basename: WBC:2,HGB:2,HCT:2,PLT:2 in the last 72 hours BMET  [facility] 11/02/11 0654  NA 141  K 3.5  CL 110  CO2 21  GLUCOSE 103*  BUN 15  CREATININE 0.88  CALCIUM 9.2    Studies/Results: No results found.  Medications: I have reviewed the patient's current medications.   Physical exam GENERAL- alert and well HEAD- normal atraumatic, no neck masses, normal thyroid, no jvd RESPIRATORY- appears well, vitals normal, no respiratory distress, acyanotic, normal RR, ear and throat exam is normal, neck free of mass or lymphadenopathy, chest clear, no wheezing, crepitations, rhonchi, normal symmetric air entry CVS- regular rate and rhythm, S1, S2 normal, no murmur, click, rub or gallop ABDOMEN- abdomen is soft without significant tenderness, masses, organomegaly or guarding NEURO- Grossly normal EXTREMITIES- edema left arm ?iv infiltration site, seems subsiding., claw hand same side.  Plan  HYPOTHYROIDISM- continue synthroid.  PARKINSON'S DISEASE-stable on current meds.  HYPERTENSION- uncontrolled. Increase olmesartan.  Sinoatrial node dysfunction  Generalized weakness- better  Gait disorder- for rehab  Anxiety disorder/Agitation- trial of xanax which daughter says she has tolerated better.    [name] 11/03/2011 6:48  PM Pager: [phone].

## 2011-11-04 DIAGNOSIS — I10 Essential (primary) hypertension: Secondary | ICD-10-CM

## 2011-11-04 DIAGNOSIS — G20 Parkinson's disease: Secondary | ICD-10-CM

## 2011-11-04 DIAGNOSIS — F329 Major depressive disorder, single episode, unspecified: Secondary | ICD-10-CM

## 2011-11-04 DIAGNOSIS — F19951 Other psychoactive substance use, unspecified with psychoactive substance-induced psychotic disorder with hallucinations: Secondary | ICD-10-CM

## 2011-11-04 LAB — PROTIME-INR
INR: 2.21 — ABNORMAL HIGH (ref 0.00–1.49)
Prothrombin Time: 24.9 seconds — ABNORMAL HIGH (ref 11.6–15.2)

## 2011-11-04 MED ORDER — ALPRAZOLAM 0.25 MG PO TABS
0.2500 mg | ORAL_TABLET | Freq: Every evening | ORAL | Status: DC | PRN
  Administered 2011-11-04: 0.25 mg via ORAL
  Filled 2011-11-04: qty 1

## 2011-11-04 MED ORDER — WARFARIN SODIUM 5 MG IV SOLR
2.0000 mg | Freq: Once | INTRAVENOUS | Status: DC
Start: 2011-11-04 — End: 2011-11-04
  Filled 2011-11-04: qty 1

## 2011-11-04 MED ORDER — WARFARIN SODIUM 2 MG PO TABS
2.0000 mg | ORAL_TABLET | Freq: Once | ORAL | Status: DC
  Filled 2011-11-04: qty 1

## 2011-11-04 MED ORDER — WARFARIN SODIUM 5 MG IV SOLR
2.0000 mg | Freq: Once | INTRAVENOUS | Status: DC
  Filled 2011-11-04: qty 1

## 2011-11-04 NOTE — Progress Notes (Addendum)
SUBJECTIVE Had peaceful night, difficult to wake up this morning. No hallucinations.   1. HTN (hypertension)   2. Weakness   3. Dehydration   4. Agitation     Past Medical History  Diagnosis Date  . CAD (coronary artery disease)   . Atrial fibrillation   . Hypertension   . Hyperlipidemia   . Depression   . Anxiety   . AV block   . Parkinson's disease   . Hypothyroid   . Orthostatic hypotension   . Renal insufficiency   . Sinoatrial node dysfunction   . Dizziness   . Hypercholesteremia   . Cough   . Sinusitis   . Parkinson disease   . Low back pain syndrome   . Femoral bruit   . Sleep apnea   . Diverticulitis, colon   . Anemia    Current Facility-Administered Medications  Medication Dose Route Frequency Provider Last Rate Last Dose  . acetaminophen (TYLENOL) tablet 650 mg  650 mg Oral Q6H PRN Michelle A. Matthews   650 mg at 11/02/11 2149  . ALPRAZolam (XANAX) tablet 0.25 mg  0.25 mg Oral QHS PRN Hamlet Lasecki      . amLODipine (NORVASC) tablet 10 mg  10 mg Oral Daily Vassie Loll, MD   10 mg at 11/04/11 1037  . bisacodyl (DULCOLAX) suppository 10 mg  10 mg Rectal Daily PRN Michelle A. Ashley Royalty      . carbidopa-levodopa (SINEMET) 25-100 MG per tablet 1 tablet  1 tablet Oral QID Michelle A. Matthews   1 tablet at 11/04/11 1406  . feeding supplement (ENSURE CLINICAL STRENGTH) liquid 237 mL  237 mL Oral TID WC Vassie Loll, MD   237 mL at 11/04/11 1240  . folic acid (FOLVITE) tablet 1 mg  1 mg Oral Daily Michelle A. Matthews   1 mg at 11/04/11 1037  . hydrALAZINE (APRESOLINE) injection 5 mg  5 mg Intravenous Q6H PRN Vassie Loll, MD   5 mg at 11/03/11 1108  . levothyroxine (SYNTHROID, LEVOTHROID) tablet 100 mcg  100 mcg Oral Daily Michelle A. Matthews   100 mcg at 11/04/11 1038  . olmesartan (BENICAR) tablet 20 mg  20 mg Oral Daily Macen Joslin   20 mg at 11/04/11 1406  . ondansetron (ZOFRAN) injection 4 mg  4 mg Intravenous Q6H PRN Vassie Loll, MD   4 mg at 11/03/11  1407  . polyethylene glycol (MIRALAX / GLYCOLAX) packet 17 g  17 g Oral Daily PRN Michelle A. Ashley Royalty      . propranolol (INDERAL LA) 24 hr capsule 80 mg  80 mg Oral Daily Michelle A. Matthews   80 mg at 11/04/11 1037  . sertraline (ZOLOFT) tablet 150 mg  150 mg Oral Daily Nehemiah Settle, MD   150 mg at 11/04/11 1406  . warfarin (COUMADIN) injection 2 mg  2 mg Intravenous ONCE-1800 Leeta Grimme       Or  . warfarin (COUMADIN) injection 2 mg  2 mg Intravenous ONCE-1800 Lazlo Tunney      . warfarin (COUMADIN) injection 3 mg  3 mg Intravenous ONCE-1800 Laurena Bering, PHARMD   3 mg at 11/03/11 1744  . DISCONTD: 0.9 % NaCl with KCl 20 mEq/ L  infusion   Intravenous Continuous Vassie Loll, MD 20 mL/hr at 11/03/11 2052    . DISCONTD: ALPRAZolam Prudy Feeler) tablet 0.25 mg  0.25 mg Oral BID PRN Zakery Normington   0.25 mg at 11/03/11 2110  . DISCONTD: haloperidol (HALDOL) 2 MG/ML solution 1  mg  1 mg Oral Q12H PRN Vassie Loll, MD   1 mg at 11/03/11 0122  . DISCONTD: haloperidol lactate (HALDOL) injection 5 mg  5 mg Intravenous Q6H PRN Mary A. Lynch      . DISCONTD: mirtazapine (REMERON SOL-TAB) disintegrating tablet 15 mg  15 mg Oral QHS Vassie Loll, MD   15 mg at 11/03/11 2109  . DISCONTD: olmesartan (BENICAR) tablet 10 mg  10 mg Oral Daily Michelle A. Matthews   10 mg at 11/03/11 1000  . DISCONTD: zolpidem (AMBIEN) tablet 5 mg  5 mg Oral QHS PRN Vassie Loll, MD       Allergies  Allergen Reactions  . Valium Other (See Comments)    "loopy"  . Amlodipine Besy-Benazepril Hcl Swelling and Rash    Takes Amlodipine at home.  [name] Amoxicillin Swelling and Rash  . Cephalexin Swelling and Rash  . Codeine Swelling and Rash  . Codeine Phosphate Swelling and Rash  . Keflex Swelling and Rash  . Labetalol Hcl Swelling and Rash  . Levofloxacin Swelling and Rash  . Niacin Swelling and Rash  . Penicillins Swelling and Rash  . Terazosin Hcl Swelling and Rash   Active Problems:  HYPOTHYROIDISM   PARKINSON'S DISEASE  HYPERTENSION  Sinoatrial node dysfunction  Generalized weakness  Gait disorder   Vital signs in last 24 hours: Temp:  [96 F (35.6 C)-98.4 F (36.9 C)] 97.1 F (36.2 C) (12/10 1300) Pulse Rate:  [60-66] 66  (12/10 1300) Resp:  [20] 20  (12/10 1300) BP: (111-174)/(56-73) 120/60 mmHg (12/10 1300) SpO2:  [95 %-98 %] 98 % (12/10 1300) Weight change:  Last BM Date: 11/02/11  Intake/Output from previous day: 12/09 0701 - 12/10 0700 In: 777 [P.O.:175; I.V.:602] Out: 525 [Urine:525] Intake/Output this shift: Total I/O In: 100 [P.O.:100] Out: -   Lab Results: No results found for this basename: WBC:2,HGB:2,HCT:2,PLT:2 in the last 72 hours BMET  [facility] 11/02/11 0654  NA 141  K 3.5  CL 110  CO2 21  GLUCOSE 103*  BUN 15  CREATININE 0.88  CALCIUM 9.2    Studies/Results: No results found.  Medications: I have reviewed the patient's current medications.   Physical exam GENERAL- alert and well HEAD- normal atraumatic, no neck masses, normal thyroid, no jvd RESPIRATORY- appears well, vitals normal, no respiratory distress, acyanotic, normal RR, ear and throat exam is normal, neck free of mass or lymphadenopathy, chest clear, no wheezing, crepitations, rhonchi, normal symmetric air entry CVS- regular rate and rhythm, S1, S2 normal, no murmur, click, rub or gallop ABDOMEN- abdomen is soft without significant tenderness, masses, organomegaly or guarding NEURO- Grossly normal EXTREMITIES- extremities normal, atraumatic, no cyanosis or edema  Plan  HYPOTHYROIDISM- continue synthroid.  PARKINSON'S DISEASE-stable on current meds.  HYPERTENSION-  Better controlled. Increase olmesartan.  Sinoatrial node dysfunction  Generalized weakness- better  Gait disorder- for rehab  Anxiety disorder/Agitation-Patient maybe oversedated on hypnotics. Will d/c haldol/remaron/ambien and keep on seroquel and xanax. Disposition- hopefully snf in  am.      Orvan Papadakis 11/04/2011 2:52 PM Pager: [phone].   afib on coumadin, inr therapeutic, rate controlled. Pharmacy appreciated.

## 2011-11-04 NOTE — Progress Notes (Signed)
Clinical social worker met with pt and pt daughter to discuss dc plans. Pt daughter completed paper work with [facility]. Pt is ready to dc to facility when medically stable. CSW continuing to follow to assist with pt dc plans.   Catha Gosselin, Theresia Majors  [phone] .11/04/2011 12:06pm

## 2011-11-04 NOTE — Progress Notes (Signed)
CSW followed up with ?hallucinations that patient was having over the weekend and through the night.  Patient hard to arouse this am, never opened her eyes and very sleepy per report of caregiver and family at bedside. Patient has been more confused and having confusion/nightmares/hallucinations in which she thinks people are trying to hurt her per report of caregiver.    Caregiver reports she slept very well last night, but hard to arouse this morning for breakfast.  Caregiver very tearful and reports she is exhausted and just wants patient to go to rehab and get better/  CSW provided emotional support for caregiver and normalized patients behaviors and all the great care she has been given and getting.  Plan is still [facility] rehab at dc once patient is medically stable.  Will continue to follow and provide support and give outpatient resources closer to time of dc.    Ashley Jacobs, MSW LCSWA [phone]

## 2011-11-04 NOTE — Progress Notes (Signed)
ANTICOAGULATION CONSULT NOTE - Follow Up Consult  Pharmacy Consult for Coumadin Indication: atrial fibrillation  Allergies  Allergen Reactions  . Valium Other (See Comments)    "loopy"  . Amlodipine Besy-Benazepril Hcl Swelling and Rash    Takes Amlodipine at home.  [name] Amoxicillin Swelling and Rash  . Cephalexin Swelling and Rash  . Codeine Swelling and Rash  . Codeine Phosphate Swelling and Rash  . Keflex Swelling and Rash  . Labetalol Hcl Swelling and Rash  . Levofloxacin Swelling and Rash  . Niacin Swelling and Rash  . Penicillins Swelling and Rash  . Terazosin Hcl Swelling and Rash    Patient Measurements: Height: 5\' 1"  (154.9 cm) Weight: 143 lb 4.8 oz (65 kg) IBW/kg (Calculated) : 47.8  Adjusted Body Weight:   Vital Signs: Temp: 97.1 F (36.2 C) (12/10 1300) Temp src: Axillary (12/10 1300) BP: 120/60 mmHg (12/10 1300) Pulse Rate: 66  (12/10 1300)  Labs:  Basename 11/04/11 1207 11/03/11 0810 11/02/11 0654  HGB -- -- --  HCT -- -- --  PLT -- -- --  APTT -- -- --  LABPROT 24.9* 23.3* 25.1*  INR 2.21* 2.03* 2.23*  HEPARINUNFRC -- -- --  CREATININE -- -- 0.88  CKTOTAL -- -- --  CKMB -- -- --  TROPONINI -- -- --   Estimated Creatinine Clearance: 42.6 ml/min (by C-G formula based on Cr of 0.88).  Assessment: 10 yof with afib on chronic coumadin. INR continues to be therapeutic at 2.2. Patient appears to be tolerating po's much better this am. No bleeding complications have been noted. Given likely decreased po intake/diet will be conservative with coumadin dosing. If INR stabilizes will change order for 3x per week while here. Goal of Therapy:  INR 2-3   Plan:  Coumadin 2mg  IV/po today INR in am Severiano Gilbert 11/04/2011,1:46 PM

## 2011-11-05 LAB — PROTIME-INR: Prothrombin Time: 25.9 seconds — ABNORMAL HIGH (ref 11.6–15.2)

## 2011-11-05 MED ORDER — AMLODIPINE BESYLATE 5 MG PO TABS: 10.0000 mg | ORAL_TABLET | Freq: Every day | ORAL | Status: DC

## 2011-11-05 MED ORDER — ALPRAZOLAM 0.25 MG PO TABS: 0.2500 mg | ORAL_TABLET | Freq: Every evening | ORAL | Status: AC | PRN

## 2011-11-05 MED ORDER — HYDRALAZINE HCL 20 MG/ML IJ SOLN
5.0000 mg | Freq: Four times a day (QID) | INTRAMUSCULAR | Status: DC | PRN
  Administered 2011-11-05: 5 mg via INTRAVENOUS
  Filled 2011-11-05: qty 0.25

## 2011-11-05 MED ORDER — ENSURE CLINICAL ST REVIGOR PO LIQD: 237.0000 mL | Freq: Three times a day (TID) | ORAL | Status: DC

## 2011-11-05 NOTE — Progress Notes (Signed)
Occupational Therapy Evaluation Patient Details Name: Colleen Morris MRN: [id number] DOB: 01-27-1927 Today's Date: 11/05/2011  OT Assessment/Plan OT Assessment/Plan Comments on Treatment Session: Pt. demonstrates improved Lt. UE and hand movement and function today.  Exercises performed, but pt. unable to follow through with HEP at this time due to cognitive deficits, and caregiver, not present.  Splinting deferred at this time - see comments.   OT Plan: Discharge plan remains appropriate OT Frequency: Min 2X/week Equipment Recommended: Defer to next venue OT Goals ADL Goals Pt Will Perform Grooming: with supervision;Sitting, chair;Supported ADL Goal: Grooming - Progress: Progressing toward goals Arm Goals Arm Goal: Additional Goal #1 - Progress: Not addressed Arm Goal: Additional Goal #2 - Progress: Not met  OT Treatment Precautions/Restrictions  Precautions Precautions: Fall Precaution Comments: h/o falls, weakness   ADL ADL Grooming: Performed;Teeth care;Minimal assistance (Pt. required mod verbal cues for sequencing and thorougness) Where Assessed - Grooming: Sitting, chair ADL Comments: Pt. with MCP extension today, but difficulty isolating PIP extension.  Minimal edema noted today.  Splinting deferred at this time due to pt. inability to consistently follow commands, skin is very fragile, and pt. demonstrating fluctuating edema which all place her at risk for developing skin problems from the splint.  Pt. does demonstrate  full PROM Mobility  Bed Mobility Rolling Left: 4: Min assist Rolling Left Details (indicate cue type and reason): Verbal and tactile cues for sequence, reaching with Rt. UE, bending Rt. LE for efficiency. Pt initiating by self more today Left Sidelying to Sit: HOB flat;3: Mod assist Left Sidelying to Sit Details (indicate cue type and reason): Verbal cues for sequence, mod assist to advance bil. LEs to EOB, min assist to bring trunk to upright position.    Sitting - Scoot to Edge of Bed: 3: Mod assist Sitting - Scoot to Edge of Bed Details (indicate cue type and reason): PT to advance pelvis with pad, pt fearful of falling off bed.  Transfers Sit to Stand: 1: +2 Total assist (pt = 50%) Sit to Stand Details (indicate cue type and reason): Performed 2 x. Verbal cues and manual facilitation for improved hip extension and anterior weight shift in standing. PT to block bil. feet. Pt still unable to utilize Lt. UE with RW. Pt with delayed reponses to cuing.  Stand to Sit: 1: +2 Total assist (pt = 60%) Stand to Sit Details: Verbal cues to square up with chair prior to sitting, pt initially trying to sit prior to reaching chair.  Exercises General Exercises - Lower Extremity Ankle Circles/Pumps: AROM;Strengthening;Both;Supine;10 reps Heel Slides: Strengthening;Both;Supine;10 reps;AAROM;AROM (slight manual resistance, heel supported) Shoulder Exercises Shoulder Flexion: AAROM;10 reps;Seated (1 set to 90; then 1 set to ~160 degrees) Shoulder Extension: AAROM;10 reps;Seated (pt. performed active reaching to ~80 scaption) Hand Exercises Digit Composite Flexion: AROM;10 reps;Seated (demonstrates ~70% composite flexion) Composite Extension: AROM;Left;10 reps;Seated (2 sets;  pt. demonstrtes MCP extension, )  End of Session OT - End of Session Activity Tolerance: Patient tolerated treatment well Patient left: in chair;with call bell in reach General Behavior During Session:  (confusopm) Cognition: Impaired, at baseline  Leron Stoffers M  11/05/2011, 12:11 PM

## 2011-11-05 NOTE — Progress Notes (Signed)
.  Clinical social worker assisted with patient discharge to skilled nursing facility. .Patient transportation provided by [employer] [facility] [employer] with patient chart copy. .No further Clinical Social Work needs, signing off.   Catha Gosselin, Theresia Majors  [phone] .11/05/2011 14:10

## 2011-11-05 NOTE — Progress Notes (Signed)
Physical Therapy Treatment Patient Details Name: Colleen Morris MRN: [id number] DOB: May 29, 1927 Today's Date: 11/05/2011  PT Assessment/Plan  PT - Assessment/Plan Comments on Treatment Session: Pt doing well, much more alert and able to follow commands today. Pt's BP= 157/65 at start of treatment, 176/73 at end of treatment and RN made aware. Pt with no c/o headache or dizziness. Spoke with daughter about maintaining skin integrity and making sure pt is sitting upright when eating.  PT Plan: Discharge plan remains appropriate PT Frequency: Min 2X/week Follow Up Recommendations: Skilled nursing facility Equipment Recommended: Defer to next venue PT Goals  Acute Rehab PT Goals PT Goal: Supine/Side to Sit - Progress: Progressing toward goal PT Goal: Sit at [facility] Of Bed - Progress: Progressing toward goal PT Goal: Sit to Stand - Progress: Progressing toward goal PT Goal: Ambulate - Progress: Progressing toward goal  PT Treatment Precautions/Restrictions  Precautions Precautions: Fall Precaution Comments: h/o falls, weakness Restrictions Weight Bearing Restrictions: No Mobility (including Balance) Bed Mobility Rolling Left: 4: Min assist Rolling Left Details (indicate cue type and reason): Verbal and tactile cues for sequence, reaching with Rt. UE, bending Rt. LE for efficiency. Pt initiating by self more today Left Sidelying to Sit: HOB flat;3: Mod assist Left Sidelying to Sit Details (indicate cue type and reason): Verbal cues for sequence, mod assist to advance bil. LEs to EOB, min assist to bring trunk to upright position.  Sitting - Scoot to Edge of Bed: 3: Mod assist Sitting - Scoot to Edge of Bed Details (indicate cue type and reason): PT to advance pelvis with pad, pt fearful of falling off bed.  Transfers Transfers: Yes Sit to Stand: 1: +2 Total assist (pt = 50%) Sit to Stand Details (indicate cue type and reason): Performed 2 x. Verbal cues and manual facilitation for  improved hip extension and anterior weight shift in standing. PT to block bil. feet. Pt still unable to utilize Lt. UE with RW. Pt with delayed reponses to cuing.  Stand to Sit: 1: +2 Total assist (pt = 60%) Stand to Sit Details: Verbal cues to square up with chair prior to sitting, pt initially trying to sit prior to reaching chair.  Stand Pivot Transfers: 1: +2 Total assist (pt = 50%) Stand Pivot Transfer Details (indicate cue type and reason): Verbal cues to weightshifting bil. with stepping, facilitation through pelvis. Pt with significant hip flexion today.  Ambulation/Gait Ambulation/Gait: No (pt unable at this time)  Posture/Postural Control Posture/Postural Control: Postural limitations Postural Limitations: Significant kyphosis, posterior lean at times Balance Balance Assessed: Yes Dynamic Sitting Balance Dynamic Sitting - Balance Activities: Lateral lean/weight shifting;Forward lean/weight shifting;Trunk control activities;Reaching for objects Dynamic Sitting - Comments: Pt only able to reach with Rt. UE Exercise  General Exercises - Lower Extremity Ankle Circles/Pumps: AROM;Strengthening;Both;Supine;10 reps Heel Slides: Strengthening;Both;Supine;10 reps;AAROM;AROM (slight manual resistance, heel supported) End of Session PT - End of Session Equipment Utilized During Treatment: Gait belt Activity Tolerance: Patient limited by fatigue (limited by fear of falling) Patient left: in chair;with call bell in reach;with family/visitor present Nurse Communication: Mobility status for transfers (elevated BP) General Behavior During Session: [facility] for tasks performed Cognition: Impaired, at baseline  Sherie Don 11/05/2011, 10:27 AM  Sherie Don) Carleene Mains PT, DPT Acute Rehabilitation [phone]

## 2011-11-05 NOTE — Discharge Summary (Signed)
DISCHARGE SUMMARY  Colleen Morris  MR#: [id number]  DOB:Aug 04, 1927  Date of Admission: 10/28/2011 Date of Discharge: 11/05/2011  Attending Physician:Alem Fahl  Patient's MVH:Colleen Laury Axon, DO, DO  Consults:Treatment Team:  Nehemiah Settle, MD  Discharge Diagnoses: Present on Admission:  .Generalized weakness .Gait disorder Acute delirium, probably related to medications. Malignant htn Sinoatrial node dysfunction Hypothyroidism Long term anticoagulation. AFIB. Hyperlipidemia.      Current Discharge Medication List    START taking these medications   Details  ALPRAZolam (XANAX) 0.25 MG tablet Take 1 tablet (0.25 mg total) by mouth at bedtime as needed for anxiety or sleep. Qty: 20 tablet, Refills: 0    feeding supplement (ENSURE CLINICAL STRENGTH) LIQD Take 237 mLs by mouth 3 (three) times daily with meals. Qty: 30 Bottle, Refills: 0      CONTINUE these medications which have CHANGED   Details  amLODipine (NORVASC) 5 MG tablet Take 2 tablets (10 mg total) by mouth daily. Qty: 30 tablet, Refills: 0      CONTINUE these medications which have NOT CHANGED   Details  Calcium Carbonate-Vit D-Min (CALCIUM 1200 PO) Take 1 tablet by mouth daily.      carbidopa-levodopa (SINEMET) 25-100 MG per tablet Take 1 tablet by mouth 4 (four) times daily. Take 1 tablet 3 times daily and 1 tablet at bedtime. 0600  1200  1700  2100    Coenzyme Q10 (CO Q 10) 100 MG CAPS Take 1 capsule by mouth daily.      folic acid (FOLVITE) 400 MCG tablet Take 400 mcg by mouth daily.     levothyroxine (SYNTHROID, LEVOTHROID) 100 MCG tablet Take 100 mcg by mouth daily.      Potassium Gluconate 595 MG TBCR Take by mouth daily.     propranolol (INDERAL LA) 80 MG 24 hr capsule Take 80 mg by mouth daily.      sertraline (ZOLOFT) 100 MG tablet Take 100 mg by mouth daily.      valsartan (DIOVAN) 80 MG tablet Take 1 tablet (80 mg total) by mouth daily. Qty: 30 tablet, Refills: 2   Associated Diagnoses: HTN (hypertension)    warfarin (COUMADIN) 2 MG tablet Take 2 mg by mouth as directed. Sunday and Wednesday: 0.5 tablet (1mg )  All other days 1 tablet (2mg )     nitroGLYCERIN (NITROSTAT) 0.4 MG SL tablet Place 0.4 mg under the tongue every 5 (five) minutes x 3 doses as needed. 1 tablet under tongue at onset of chest pain may repeat in 5 min for up to 3 doses.      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 Patient found to have hallucination during the hospital stay. She has some baseline dementia but some of this could be related to medications. Her BP control needs optimization. INR therapeutic. She tolerated xanax pretty well for anxiety but other meds like ambien/remeron seemed to cause her confusion. She has been referred to a SNF for rehab.   Day of Discharge BP 176/73  Pulse 61  Temp(Src) 97.9 F (36.6 C) (Axillary)  Resp 14  Ht 5\' 1"  (1.549 m)  Wt 65 kg (143 lb 4.8 oz)  BMI 27.08 kg/m2  SpO2 96%  Physical Exam: Sitting up in chair, not in distress.  Results for  orders placed during the hospital encounter of 10/28/11 (from the past 24 hour(s))  PROTIME-INR     Status: Abnormal   Collection Time   11/05/11  6:22 AM      Component Value Range   Prothrombin Time 25.9 (*) 11.6 - 15.2 (seconds)   INR 2.32 (*) 0.00 - 1.49     Disposition: SNF   Follow-up Appts: Discharge Orders    Future Appointments: Provider: Department: Dept Phone: Center:   11/13/2011 3:30 PM Shawnie Pons, MD [facility] [phone] [facility]     Future Orders Please Complete By Expires   Diet - low sodium heart healthy      Increase activity slowly         Follow-up with Dr.at SNF.   Time spent 25 minutes.  Signed: Cosme Jacob 11/05/2011, 12:59 PM

## 2011-11-06 ENCOUNTER — Ambulatory Visit: Payer: Medicare Other | Admitting: Cardiology

## 2011-11-06 NOTE — Progress Notes (Signed)
   CARE MANAGEMENT NOTE 11/06/2011  Patient:  Colleen Morris, Colleen Morris   Account Number:  [id number]  Date Initiated:  11/01/2011  Documentation initiated by:  Onnie Boer  Subjective/Objective Assessment:   PT WAS ADMITTED WITH WEAKNESS AND AMS     Action/Plan:   PROGRESSION OF CARE AND DISCHARGE PLANNING   Anticipated DC Date:  11/05/2011   Anticipated DC Plan:  SKILLED NURSING FACILITY  In-house referral  Clinical Social Worker      DC Planning Services  CM consult      Choice offered to / List presented to:             Status of service:  Completed, signed off Medicare Important Message given?   (If response is "NO", the following Medicare IM given date fields will be blank) Date Medicare IM given:   Date Additional Medicare IM given:    Discharge Disposition:  SKILLED NURSING FACILITY  Per UR Regulation:  Reviewed for med. necessity/level of care/duration of stay  Comments:  11/06/11 Onnie Boer, RN, BSN PT WAS DC'D TO SNF ON 12/11  UR COMPLETED 11/01/2011 Onnie Boer, RN, BSN 1653 PT WAS ADMITTED WITH WEAKNESS AND AMS. DC PLAN IS FOR SNF. PT HAS BEEN STARTED.

## 2011-11-13 ENCOUNTER — Telehealth: Payer: Self-pay

## 2011-11-13 ENCOUNTER — Ambulatory Visit: Payer: Medicare Other | Admitting: Cardiology

## 2011-11-13 NOTE — Telephone Encounter (Signed)
Ok - note done

## 2011-11-13 NOTE — Telephone Encounter (Signed)
Call from [place] and she stated that Colleen Morris had been in the hospital and released to Rehab due to her Parkinson's disease. She is currently on the waiting list for [facility] which is a [employer] as [employer], she will be placed on 11/26/11 and will have to break her lease at her apartment complex. Colleen Morris wanted to know if we could write a letter advising the apartment complex that patient will need to move due to medical complications or something stating it is medically necessary for the patient to move because patient is not supposed to walk up and down the stairs and she is currently in an upstairs apartment. Please advise    KP

## 2011-11-14 NOTE — Telephone Encounter (Signed)
VM left advising Colleen Morris that the left is ready for pick up..... Left at check in    KP

## 2011-11-28 ENCOUNTER — Encounter: Payer: Self-pay | Admitting: Internal Medicine

## 2011-12-27 ENCOUNTER — Ambulatory Visit: Payer: Medicare Other | Admitting: Cardiology

## 2012-01-29 ENCOUNTER — Ambulatory Visit: Payer: Medicare Other | Admitting: Cardiology

## 2012-02-25 ENCOUNTER — Ambulatory Visit: Payer: Medicare Other | Admitting: Cardiology

## 2012-03-24 ENCOUNTER — Ambulatory Visit: Payer: Medicare Other | Admitting: Cardiology

## 2012-04-19 ENCOUNTER — Emergency Department (HOSPITAL_COMMUNITY): Payer: Medicare Other

## 2012-04-19 ENCOUNTER — Emergency Department (HOSPITAL_COMMUNITY)
Admission: EM | Admit: 2012-04-19 | Discharge: 2012-04-20 | Disposition: A | Discharge status: Skilled Nursing Facility | Payer: Medicare Other | Attending: Emergency Medicine | Admitting: Emergency Medicine

## 2012-04-19 ENCOUNTER — Encounter (HOSPITAL_COMMUNITY): Payer: Self-pay | Admitting: *Deleted

## 2012-04-19 DIAGNOSIS — I2510 Atherosclerotic heart disease of native coronary artery without angina pectoris: Secondary | ICD-10-CM | POA: Insufficient documentation

## 2012-04-19 DIAGNOSIS — E7800 Pure hypercholesterolemia, unspecified: Secondary | ICD-10-CM | POA: Insufficient documentation

## 2012-04-19 DIAGNOSIS — I10 Essential (primary) hypertension: Secondary | ICD-10-CM | POA: Insufficient documentation

## 2012-04-19 DIAGNOSIS — F341 Dysthymic disorder: Secondary | ICD-10-CM | POA: Insufficient documentation

## 2012-04-19 DIAGNOSIS — Y9210 Unspecified residential institution as the place of occurrence of the external cause: Secondary | ICD-10-CM | POA: Insufficient documentation

## 2012-04-19 DIAGNOSIS — R259 Unspecified abnormal involuntary movements: Secondary | ICD-10-CM | POA: Insufficient documentation

## 2012-04-19 DIAGNOSIS — R4701 Aphasia: Secondary | ICD-10-CM | POA: Insufficient documentation

## 2012-04-19 DIAGNOSIS — E039 Hypothyroidism, unspecified: Secondary | ICD-10-CM | POA: Insufficient documentation

## 2012-04-19 DIAGNOSIS — Z7901 Long term (current) use of anticoagulants: Secondary | ICD-10-CM | POA: Insufficient documentation

## 2012-04-19 DIAGNOSIS — X58XXXA Exposure to other specified factors, initial encounter: Secondary | ICD-10-CM | POA: Insufficient documentation

## 2012-04-19 DIAGNOSIS — Z79899 Other long term (current) drug therapy: Secondary | ICD-10-CM | POA: Insufficient documentation

## 2012-04-19 DIAGNOSIS — S2000XA Contusion of breast, unspecified breast, initial encounter: Secondary | ICD-10-CM | POA: Insufficient documentation

## 2012-04-19 DIAGNOSIS — I4891 Unspecified atrial fibrillation: Secondary | ICD-10-CM | POA: Insufficient documentation

## 2012-04-19 DIAGNOSIS — G20A1 Parkinson's disease without dyskinesia, without mention of fluctuations: Secondary | ICD-10-CM | POA: Insufficient documentation

## 2012-04-19 DIAGNOSIS — R791 Abnormal coagulation profile: Secondary | ICD-10-CM | POA: Insufficient documentation

## 2012-04-19 DIAGNOSIS — R58 Hemorrhage, not elsewhere classified: Secondary | ICD-10-CM

## 2012-04-19 DIAGNOSIS — G20 Parkinson's disease: Secondary | ICD-10-CM | POA: Insufficient documentation

## 2012-04-19 DIAGNOSIS — E785 Hyperlipidemia, unspecified: Secondary | ICD-10-CM | POA: Insufficient documentation

## 2012-04-19 DIAGNOSIS — N644 Mastodynia: Secondary | ICD-10-CM | POA: Insufficient documentation

## 2012-04-19 LAB — COMPREHENSIVE METABOLIC PANEL
ALT: <5 U/L (ref 0–35)
AST: 35 U/L (ref 0–37)
Alkaline Phosphatase: 49 U/L (ref 39–117)
CO2: 24 mEq/L (ref 19–32)
Chloride: 110 mEq/L (ref 96–112)
Creatinine, Ser: 1.49 mg/dL — ABNORMAL HIGH (ref 0.50–1.10)
GFR calc non Af Amer: 31 mL/min — ABNORMAL LOW (ref >90)
Potassium: 4.3 mEq/L (ref 3.5–5.1)
Sodium: 143 mEq/L (ref 135–145)
Total Bilirubin: 0.3 mg/dL (ref 0.3–1.2)

## 2012-04-19 LAB — CBC
HCT: 24.5 % — ABNORMAL LOW (ref 36.0–46.0)
RDW: 14.2 % (ref 11.5–15.5)
WBC: 12.5 10*3/uL — ABNORMAL HIGH (ref 4.0–10.5)

## 2012-04-19 LAB — DIFFERENTIAL
Basophils Absolute: 0 10*3/uL (ref 0.0–0.1)
Lymphocytes Relative: 14 % (ref 12–46)
Monocytes Absolute: 1.1 10*3/uL — ABNORMAL HIGH (ref 0.1–1.0)
Neutro Abs: 9.4 10*3/uL — ABNORMAL HIGH (ref 1.7–7.7)
Neutrophils Relative %: 75 % (ref 43–77)

## 2012-04-19 MED ORDER — KETOROLAC TROMETHAMINE 30 MG/ML IJ SOLN
30.0000 mg | Freq: Once | INTRAMUSCULAR | Status: AC
  Administered 2012-04-19: 30 mg via INTRAVENOUS

## 2012-04-19 MED ORDER — SODIUM CHLORIDE 0.9 % IV BOLUS (SEPSIS)
1000.0000 mL | Freq: Once | INTRAVENOUS | Status: AC
  Administered 2012-04-19: 1000 mL via INTRAVENOUS

## 2012-04-19 MED ORDER — KETOROLAC TROMETHAMINE 30 MG/ML IJ SOLN
INTRAMUSCULAR | Status: AC
  Filled 2012-04-19: qty 1

## 2012-04-19 MED ORDER — ACETAMINOPHEN-CODEINE 120-12 MG/5ML PO SOLN
5.0000 mL | Freq: Once | ORAL | Status: AC
  Administered 2012-04-20: 5 mL via ORAL
  Filled 2012-04-19: qty 10

## 2012-04-19 MED ORDER — ACETAMINOPHEN 500 MG PO TABS
1000.0000 mg | ORAL_TABLET | Freq: Once | ORAL | Status: DC
  Filled 2012-04-19: qty 2

## 2012-04-19 NOTE — Discharge Instructions (Signed)
Please make sure to abstain from taking Coumadin both today and tomorrow.  It is very important to have your Coumadin level checked in 2 days.  You should also discuss whether this medication continues to be appropriate for you with your primary care physician.  If you develop any new or concerning changes in your condition, such as fever, cough, difficulty breathing, please return to the emergency department immediately.

## 2012-04-19 NOTE — ED Provider Notes (Addendum)
History     CSN: [id number]  Arrival date & time 04/19/12  2120   First MD Initiated Contact with Patient 04/19/12 2123      Chief Complaint  Patient presents with  . Breast Pain    (Consider location/radiation/quality/duration/timing/severity/associated sxs/prior treatment) HPI The patient p/w conerns of ecchymosis in the L breast.  She is aphasic at baseline.  Report is per NH.  They do not report trauma, falls, incidental events or any medication changes.  The patient cannot provide any HPI. Past Medical History  Diagnosis Date  . CAD (coronary artery disease)   . Atrial fibrillation   . Hypertension   . Hyperlipidemia   . Depression   . Anxiety   . AV block   . Parkinson's disease   . Hypothyroid   . Orthostatic hypotension   . Renal insufficiency   . Sinoatrial node dysfunction   . Dizziness   . Hypercholesteremia   . Cough   . Sinusitis   . Parkinson disease   . Low back pain syndrome   . Femoral bruit   . Sleep apnea   . Diverticulitis, colon   . Anemia     Past Surgical History  Procedure Date  . Pacemaker insertion   . Coronary artery bypass graft   . Cataract extraction   . Polypectomy   . Right l4-5 far-lateral metrix microdiscectomy     Family History  Problem Relation Age of Onset  . Heart disease Sister   . Heart failure Brother     CHF  . Emphysema Sister   . Osteoporosis Sister   . Alzheimer's disease Sister     History  Substance Use Topics  . Smoking status: Never Smoker   . Smokeless tobacco: Never Used  . Alcohol Use: No    OB History    Grav Para Term Preterm Abortions TAB SAB Ect Mult Living                  Review of Systems  Unable to perform ROS: Dementia    Allergies  Valium; Amlodipine besy-benazepril hcl; Amoxicillin; Cephalexin; Cephalexin; Codeine; Codeine phosphate; Labetalol hcl; Levofloxacin; Niacin; Penicillins; and Terazosin hcl  Home Medications   Current Outpatient Rx  Name Route Sig Dispense  Refill  . ACETAMINOPHEN 325 MG PO TABS Oral Take 650 mg by mouth every 6 (six) hours as needed. For pain    . ALBUTEROL SULFATE (2.5 MG/3ML) 0.083% IN NEBU Nebulization Take 2.5 mg by nebulization every 4 (four) hours as needed. For shortness of breath    . AMLODIPINE BESYLATE 10 MG PO TABS Oral Take 10 mg by mouth daily.    [name] CALCIUM CARBONATE-VITAMIN D 600-400 MG-UNIT PO TABS Oral Take 2 tablets by mouth daily.    [name] CARBIDOPA-LEVODOPA 25-100 MG PO TABS Oral Take 1 tablet by mouth 4 (four) times daily. 1000, 1400, 1900, 2200    . CO Q 10 100 MG PO CAPS Oral Take 1 capsule by mouth daily.      . RESOURCE BREEZE PO LIQD Oral Take 1 Container by mouth 3 (three) times daily with meals.    [name] FOLIC ACID 400 MCG PO TABS Oral Take 400 mcg by mouth daily.     [name] LEVOTHYROXINE SODIUM 100 MCG PO TABS Oral Take 100 mcg by mouth daily.      [name] LIDOCAINE 5 % EX PTCH Transdermal Place 1 patch onto the skin daily. Remove & Discard patch within 12 hours or as directed by  MD    . LORAZEPAM 0.5 MG PO TABS Oral Take 0.5 mg by mouth 4 (four) times daily as needed. For agitation    . NITROGLYCERIN 0.4 MG SL SUBL Sublingual Place 0.4 mg under the tongue every 5 (five) minutes as needed. For chest pain    . POTASSIUM GLUCONATE ER 595 MG PO TBCR Oral Take 1 tablet by mouth daily.     [name] PROPRANOLOL HCL ER 80 MG PO CP24 Oral Take 80 mg by mouth daily.      . SERTRALINE HCL 100 MG PO TABS Oral Take 100 mg by mouth daily.      . SERTRALINE HCL 25 MG PO TABS Oral Take 25 mg by mouth daily. Take with 100mg  to equal 125mg     . WARFARIN SODIUM 2 MG PO TABS Oral Take 2 mg by mouth every evening.      BP 108/32  Pulse 60  Temp(Src) 97.8 F (36.6 C) (Oral)  Resp 18  SpO2 98%  Physical Exam  Nursing note and vitals reviewed. Constitutional: She has a sickly appearance. No distress.       Case d/w EMS, and NH records reviewed.  HENT:  Head: Atraumatic. Head is without raccoon's eyes, without Battle's sign, without  laceration, without right periorbital erythema and without left periorbital erythema.  Eyes: Conjunctivae are normal. Pupils are equal, round, and reactive to light.       Moves all eom appropriately though she doesn't track.    Cardiovascular: Normal pulses.   Pulmonary/Chest: No stridor.    Abdominal: She exhibits no distension.  Musculoskeletal: She exhibits no edema.  Neurological: She displays atrophy and tremor. She exhibits abnormal muscle tone. She displays no seizure activity.       Patient cannot participate in exam, she is curled up, maes, and is chattering, nonsensically.  Skin: Skin is warm. She is not diaphoretic.       No notable changes beyond the ecchymosis  Psychiatric:       Unable to assess    ED Course  Procedures (including critical care time)   Labs Reviewed  CBC  DIFFERENTIAL  COMPREHENSIVE METABOLIC PANEL  PROTIME-INR   No results found.   No diagnosis found.  Cardiac:60sr, normal Pulse ox 100%ra, normal    Date: 04/19/2012  Rate: 62  Rhythm: paced  QRS Axis: right  Intervals: paced  ST/T Wave abnormalities: paced  Conduction Disutrbances:paced  Narrative Interpretation:   Old EKG Reviewed: unchanged  ABNORMAL  MDM  This nursing home patient with Parkinson's, baseline aphasia now presents with several days of ecchymosis about the left breast.  On exam the patient is in no distress, though she is a notable superficial change throughout the left breast, the medial right breast and the proximal left upper extremity.  The patient's vital signs are unremarkable.  The patient's x-ray is also unremarkable.  The patient is supratherapeutic with her Coumadin use.  Although there is no Hx of observed trauma, incidental contusions may result in significant ecchymosis w supra therapeutic INRGiven the patient's DO NOT RESUSCITATE status, head CT was not indicated.  The patient received analgesics, was discharged to her nursing facility with instructions to  hold tonight and tomorrow Coumadin.  She also needs to have her INR checked in 2 days.    Gerhard Munch, MD 04/19/12 2256  Gerhard Munch, MD 04/19/12 2304  11:54 PM Prior to discharge the patient's family arrived.  I discussed all findings, thoughts with him.  Specifically we discussed  the patient's supratherapeutic INR, the suspicion of incidental trauma as a nidus for her ecchymosis.  The patient's family was provided return precautions, and we discussed the need for improved coordination of her Coumadin use.  They requested the patient receive additional analgesics prior to discharge.  They also state the patient's previous he tolerated, with codeine, Darvocet.  Although the patient is elicited allergy to codeine, she was given Tylenol with #3 here and discharged.  Gerhard Munch, MD 04/19/12 2356

## 2012-04-19 NOTE — ED Notes (Signed)
Per EMS: pt c/o left breast pain that began today.  Left breast is bruised and hard, denies trauma.

## 2012-04-19 NOTE — ED Notes (Signed)
Pt from [facility]

## 2012-04-19 NOTE — ED Notes (Signed)
EKG done by EMT R Manson Passey Old and New EKG given to Dr Jeraldine Loots

## 2012-04-19 NOTE — ED Notes (Signed)
Patient presents via EMS with left breast and shoulder bruised.  Bruising is migrating towards the right breast.  Patient states "it hurts"

## 2012-04-20 NOTE — ED Notes (Signed)
Mouth suctioned of clear sputum due to cough.  Colleen Morris called at [facility] and stated that they wanted a Ultrasound done due to the hard spot on her left breast.  Explained that the MD did an xray and labs and determined that her INR was high and instructed to hold Coumadin for 2 days.

## 2012-04-20 NOTE — ED Notes (Signed)
Attends changed, cleaned up of small amount of stool

## 2012-04-22 ENCOUNTER — Other Ambulatory Visit: Payer: Self-pay | Admitting: Internal Medicine

## 2012-04-22 DIAGNOSIS — N6489 Other specified disorders of breast: Secondary | ICD-10-CM

## 2012-04-25 DEATH — deceased

## 2012-04-27 ENCOUNTER — Ambulatory Visit: Payer: Self-pay | Admitting: Cardiology

## 2012-04-29 ENCOUNTER — Other Ambulatory Visit: Payer: Medicare Other

## 2012-06-10 IMAGING — CT CT HEAD WO/W CM
1 of 2 series · 14 of 30 positions shown, 18 images · non-contrast
Comparison: none

[Series 4: head w/ · axial · 0.43mm/px · z∈[+35,+154]mm · 14 of 32 slices shown, 18 images]
[im 3/32  brain]
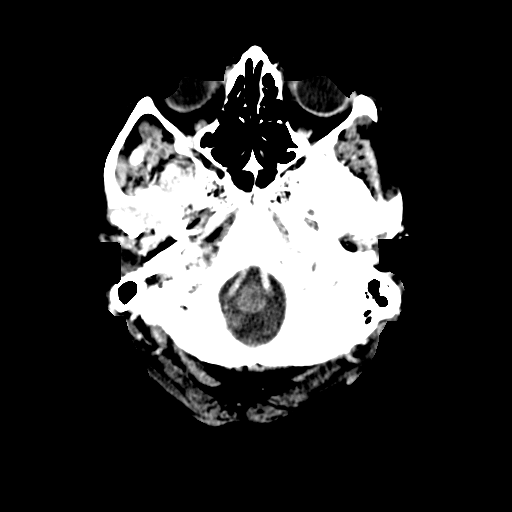
[im 3/32  bone]
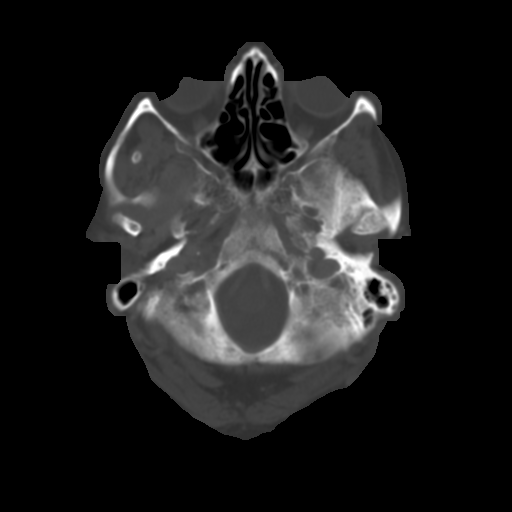
[im 5/32  brain]
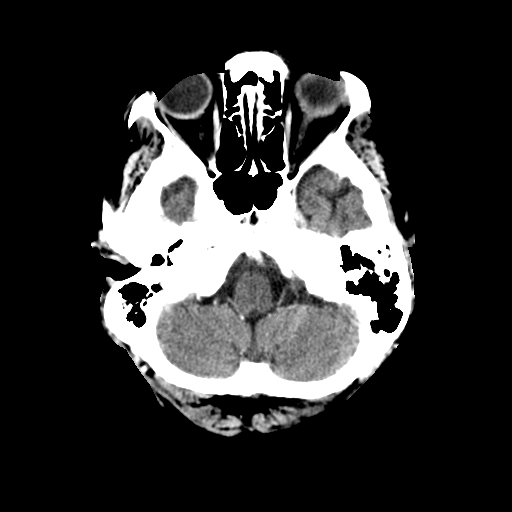
[im 7/32  brain]
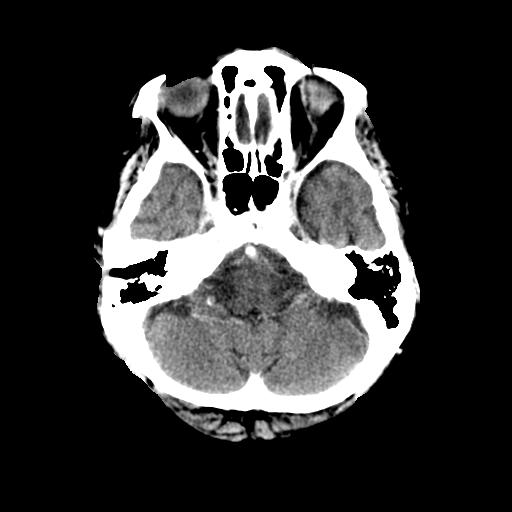
[im 9/32  brain]
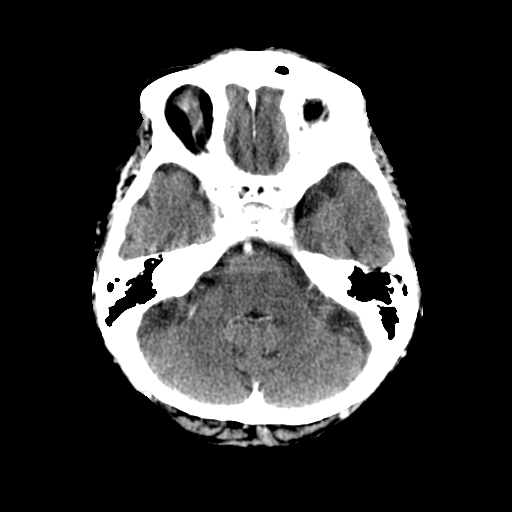
[im 11/32  brain]
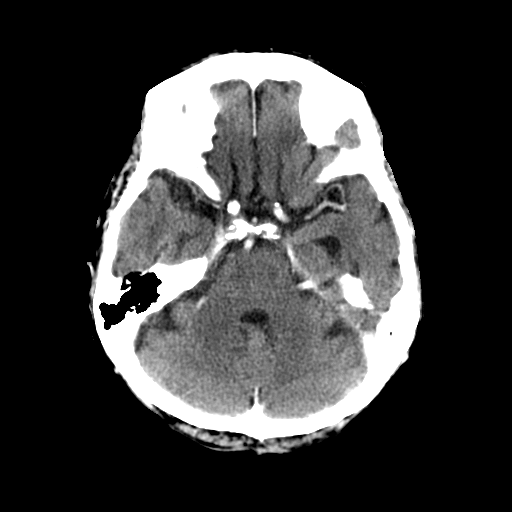
[im 11/32  bone]
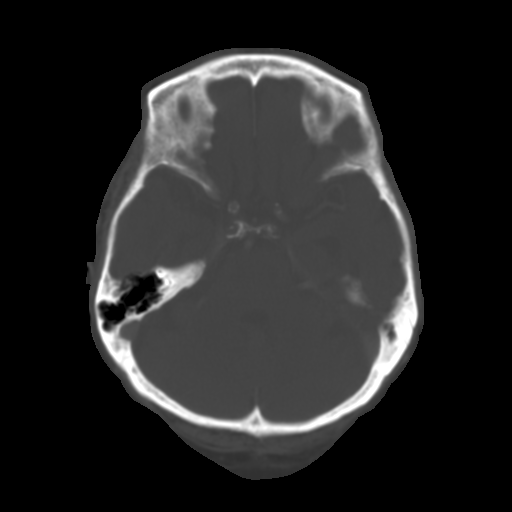
[im 13/32  brain]
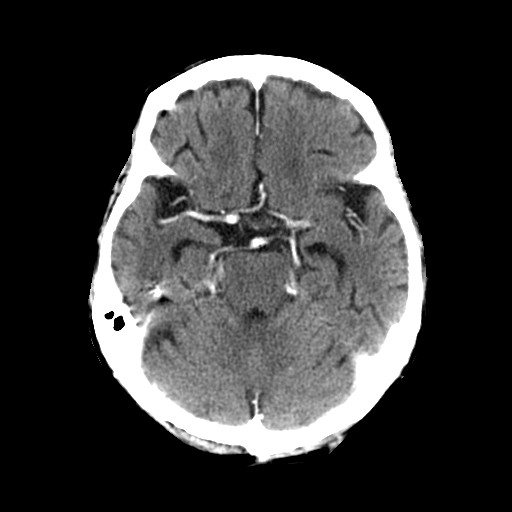
[im 15/32  brain]
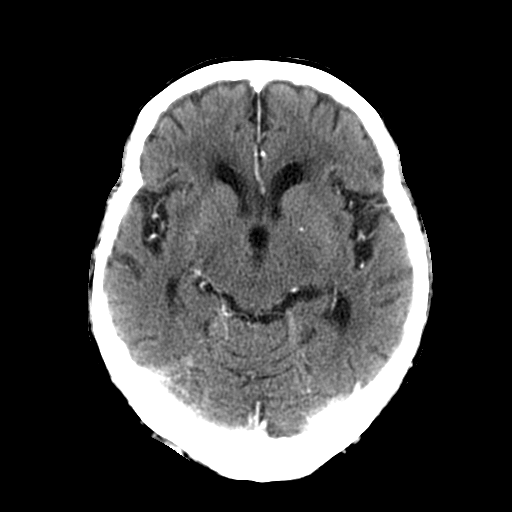
[im 17/32  brain]
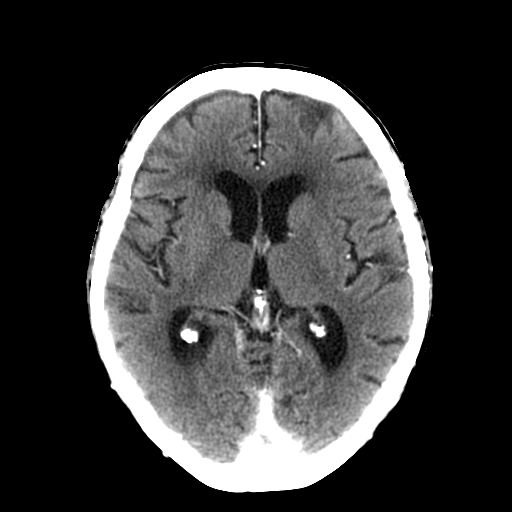
[im 19/32  brain]
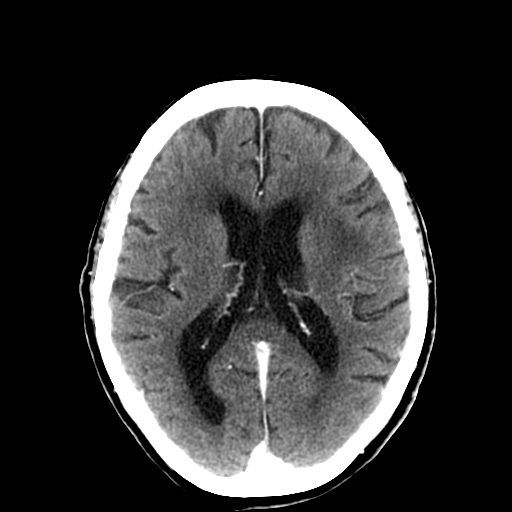
[im 19/32  bone]
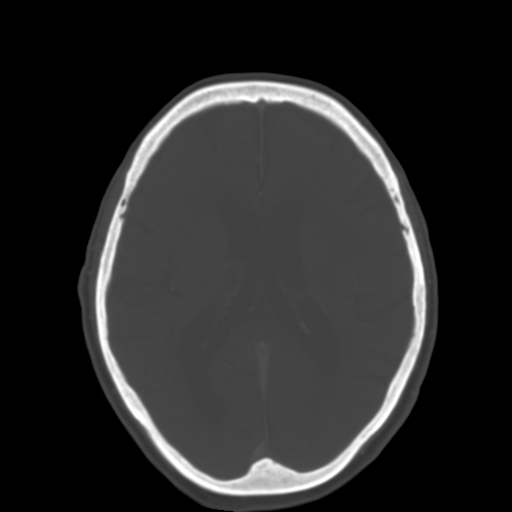
[im 21/32  brain]
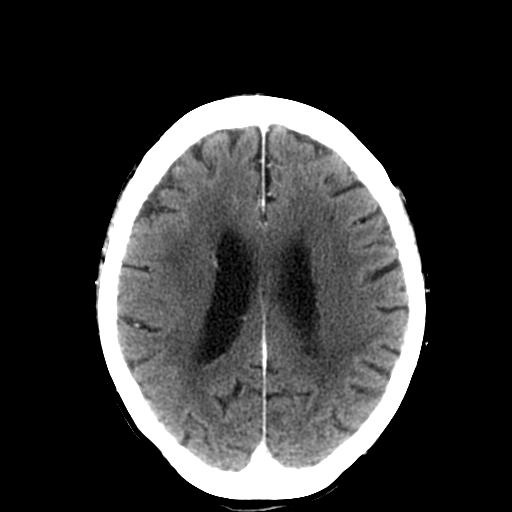
[im 23/32  brain]
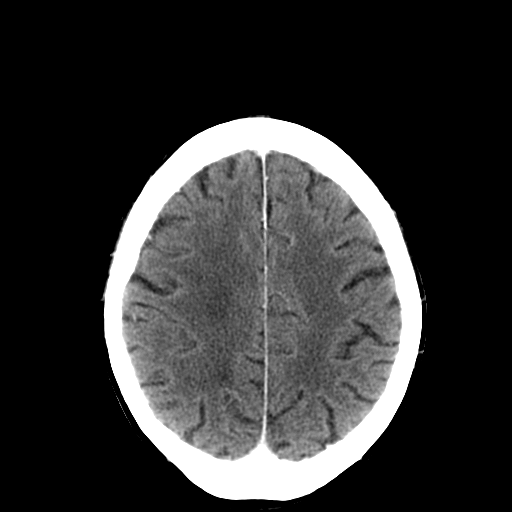
[im 25/32  brain]
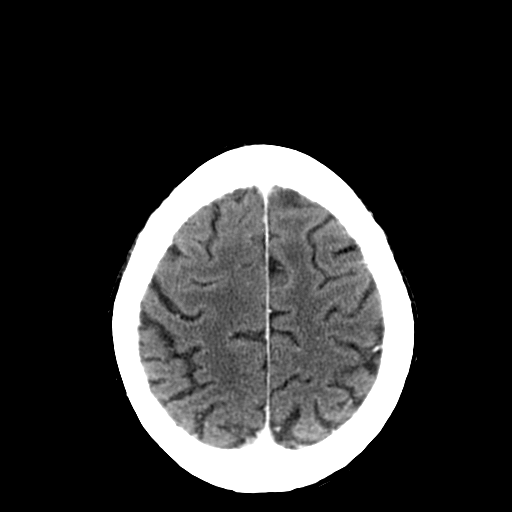
[im 27/32  brain]
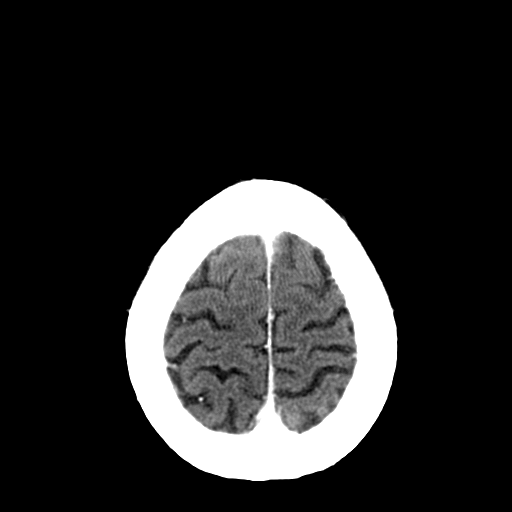
[im 27/32  bone]
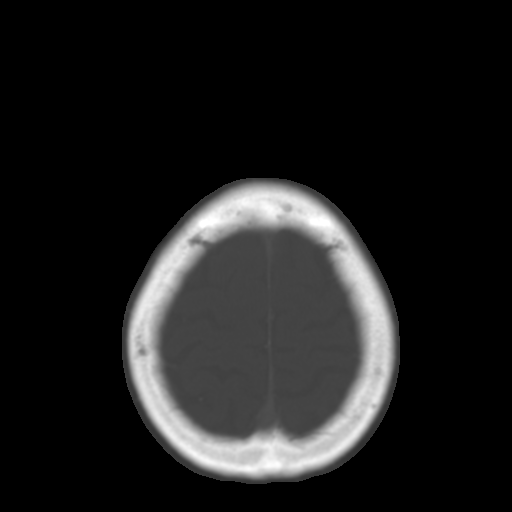
[im 29/32  brain]
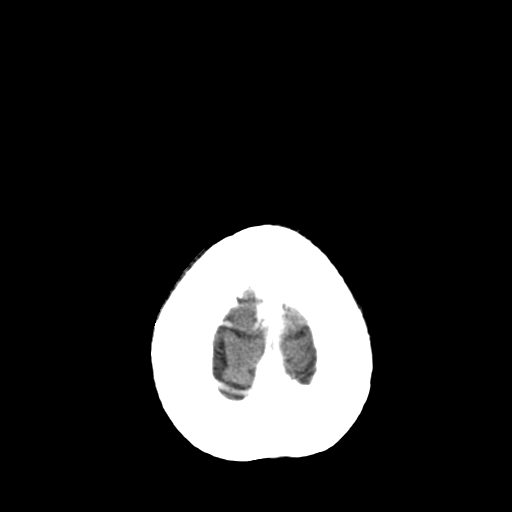

[14 of 30 positions shown; findings below may reference images not displayed]

This examination was performed at [HOSPITAL] at [HOSPITAL]. The interpretation will be provided by [REDACTED].

## 2012-09-22 ENCOUNTER — Telehealth: Payer: Self-pay | Admitting: Internal Medicine

## 2012-09-22 NOTE — Telephone Encounter (Signed)
09-22-12 lmm @ 420pm for pt to set up past due pacer check with [facility], was due in august/mt

## 2013-01-05 ENCOUNTER — Encounter: Payer: Self-pay | Admitting: *Deleted

## 2013-05-06 ENCOUNTER — Encounter: Payer: Self-pay | Admitting: Internal Medicine

## 2013-05-10 ENCOUNTER — Telehealth: Payer: Self-pay | Admitting: Internal Medicine

## 2013-05-10 NOTE — Telephone Encounter (Deleted)
Patients family just wants to Thank You Dr Graciela Husbands for everything he did for Colleen Morris while she was alive.  She said that she passed away.

## 2013-05-27 ENCOUNTER — Telehealth: Payer: Self-pay | Admitting: Internal Medicine

## 2013-05-27 NOTE — Telephone Encounter (Signed)
May 15, 2013 Deceased. Pt is deceased. per family

## 2013-06-09 ENCOUNTER — Encounter: Payer: Self-pay | Admitting: Family Medicine

## 2013-06-09 ENCOUNTER — Telehealth: Payer: Self-pay | Admitting: Internal Medicine

## 2013-06-09 NOTE — Telephone Encounter (Signed)
Pt deceased 05-10-13 pr family
